# Patient Record
Sex: Female | Born: 1980 | Race: White | Hispanic: No | Marital: Married | Smoking: Never smoker
Health system: Southern US, Community
[De-identification: ages and names within clinical notes are randomized; demographics above are authoritative.]

## PROBLEM LIST (undated history)

## (undated) DIAGNOSIS — F419 Anxiety disorder, unspecified: Secondary | ICD-10-CM

## (undated) DIAGNOSIS — F32A Depression, unspecified: Secondary | ICD-10-CM

## (undated) DIAGNOSIS — Z8781 Personal history of (healed) traumatic fracture: Secondary | ICD-10-CM

## (undated) DIAGNOSIS — E0500 Thyrotoxicosis with diffuse goiter without thyrotoxic crisis or storm: Secondary | ICD-10-CM

## (undated) DIAGNOSIS — F909 Attention-deficit hyperactivity disorder, unspecified type: Secondary | ICD-10-CM

## (undated) HISTORY — PX: APPENDECTOMY: SHX54

---

## 2009-10-27 ENCOUNTER — Ambulatory Visit: Payer: Self-pay | Admitting: Internal Medicine

## 2010-02-16 ENCOUNTER — Ambulatory Visit: Payer: Self-pay | Admitting: Family Medicine

## 2011-01-25 ENCOUNTER — Ambulatory Visit: Payer: Self-pay | Admitting: Internal Medicine

## 2019-04-04 ENCOUNTER — Encounter (HOSPITAL_COMMUNITY): Payer: Self-pay | Admitting: Emergency Medicine

## 2019-04-04 ENCOUNTER — Other Ambulatory Visit: Payer: Self-pay

## 2019-04-04 ENCOUNTER — Emergency Department (HOSPITAL_COMMUNITY): Payer: BC Managed Care – PPO

## 2019-04-04 ENCOUNTER — Emergency Department (HOSPITAL_COMMUNITY)
Admission: EM | Admit: 2019-04-04 | Discharge: 2019-04-04 | Disposition: A | Discharge status: Home / Self Care | Payer: BC Managed Care – PPO | Attending: Emergency Medicine | Admitting: Emergency Medicine

## 2019-04-04 DIAGNOSIS — M79632 Pain in left forearm: Secondary | ICD-10-CM | POA: Diagnosis present

## 2019-04-04 DIAGNOSIS — M778 Other enthesopathies, not elsewhere classified: Secondary | ICD-10-CM

## 2019-04-04 DIAGNOSIS — M779 Enthesopathy, unspecified: Secondary | ICD-10-CM | POA: Insufficient documentation

## 2019-04-04 MED ORDER — KETOROLAC TROMETHAMINE 10 MG PO TABS
10.0000 mg | ORAL_TABLET | Freq: Once | ORAL | Status: AC
  Administered 2019-04-04: 23:00:00 10 mg via ORAL
  Filled 2019-04-04: qty 1

## 2019-04-04 MED ORDER — DICLOFENAC SODIUM 75 MG PO TBEC: 75.0000 mg | DELAYED_RELEASE_TABLET | Freq: Two times a day (BID) | ORAL | 0 refills | Status: DC

## 2019-04-04 MED ORDER — ACETAMINOPHEN 500 MG PO TABS
1000.0000 mg | ORAL_TABLET | Freq: Once | ORAL | Status: AC
  Administered 2019-04-04: 1000 mg via ORAL
  Filled 2019-04-04: qty 2

## 2019-04-04 MED ORDER — ONDANSETRON HCL 4 MG PO TABS
4.0000 mg | ORAL_TABLET | Freq: Once | ORAL | Status: AC
  Administered 2019-04-04: 23:00:00 4 mg via ORAL
  Filled 2019-04-04: qty 1

## 2019-04-04 MED ORDER — DEXAMETHASONE 4 MG PO TABS: 4.0000 mg | ORAL_TABLET | Freq: Two times a day (BID) | ORAL | 0 refills | Status: DC

## 2019-04-04 MED ORDER — PREDNISONE 20 MG PO TABS
40.0000 mg | ORAL_TABLET | Freq: Once | ORAL | Status: AC
  Administered 2019-04-04: 23:00:00 40 mg via ORAL
  Filled 2019-04-04: qty 2

## 2019-04-04 MED ORDER — HYDROCODONE-ACETAMINOPHEN 5-325 MG PO TABS: 1.0000 | ORAL_TABLET | ORAL | 0 refills | Status: DC | PRN

## 2019-04-04 NOTE — ED Provider Notes (Signed)
[facility] EMERGENCY DEPARTMENT Provider Note   CSN: [id number] Arrival date & time: 04/04/19  2132     History   Chief Complaint Chief Complaint  Patient presents with  . Arm Injury    HPI Alyssa Lutz is a 38 y.o. female.     Patient is a 38 year old female who presents to the emergency department with a complaint of right wrist and forearm area pain.  The patient states that around noon today she was reaching into her purse, she twisted her wrist forearm in an awkward position and she is been having pain has been increasing since that time.  The pain is in both the wrist and the forearm.  The patient states that she has a rare osteoporosis condition, and she has been told that she could have fractures very easily.  She is concerned because she is having increasing pain from noon up until the night.  She says that she has good function of the hand and wrist, but pain when she flexes her wrist or her fingers.  No injury to the elbow or shoulder.  No recent high fever, no chills, no operations or procedures, no falls or accidents reported.  The patient has not taken any medication for her discomfort up to this point.  The history is provided by the patient.    History reviewed. No pertinent past medical history.  There are no active problems to display for this patient.   History reviewed. No pertinent surgical history.   OB History   No obstetric history on file.      Home Medications    Prior to Admission medications   Not on File    Family History History reviewed. No pertinent family history.  Social History Social History   Tobacco Use  . Smoking status: Unknown If Ever Smoked  Substance Use Topics  . Alcohol use: Not on file  . Drug use: Not on file     Allergies   Patient has no allergy information on record.   Review of Systems Review of Systems  Constitutional: Negative for activity change and appetite change.  HENT: Negative for  congestion, ear discharge, ear pain, facial swelling, nosebleeds, rhinorrhea, sneezing and tinnitus.   Eyes: Negative for photophobia, pain and discharge.  Respiratory: Negative for cough, choking, shortness of breath and wheezing.   Cardiovascular: Negative for chest pain, palpitations and leg swelling.  Gastrointestinal: Negative for abdominal pain, blood in stool, constipation, diarrhea, nausea and vomiting.  Genitourinary: Negative for difficulty urinating, dysuria, flank pain, frequency and hematuria.  Musculoskeletal: Positive for arthralgias. Negative for back pain, gait problem, myalgias and neck pain.  Skin: Negative for color change, rash and wound.  Neurological: Negative for dizziness, seizures, syncope, facial asymmetry, speech difficulty, weakness and numbness.  Hematological: Negative for adenopathy. Does not bruise/bleed easily.  Psychiatric/Behavioral: Negative for agitation, confusion, hallucinations, self-injury and suicidal ideas. The patient is not nervous/anxious.      Physical Exam Updated Vital Signs BP (!) 137/93 (BP Location: Left Arm)   Pulse (!) 105   Temp 98.4 F (36.9 C) (Oral)   Resp 18   Ht 5\' 7"  (1.702 m)   Wt 79.4 kg   LMP 04/04/2019 (Approximate)   SpO2 100%   BMI 27.41 kg/m   Physical Exam Vitals signs and nursing note reviewed.  Constitutional:      Appearance: She is well-developed. She is not toxic-appearing.  HENT:     Head: Normocephalic.     Right Ear:  Tympanic membrane and external ear normal.     Left Ear: Tympanic membrane and external ear normal.  Eyes:     General: Lids are normal.     Pupils: Pupils are equal, round, and reactive to light.  Neck:     Musculoskeletal: Normal range of motion and neck supple.     Vascular: No carotid bruit.  Cardiovascular:     Rate and Rhythm: Normal rate and regular rhythm.     Pulses: Normal pulses.     Heart sounds: Normal heart sounds.  Pulmonary:     Effort: No respiratory distress.      Breath sounds: Normal breath sounds.  Abdominal:     General: Bowel sounds are normal.     Palpations: Abdomen is soft.     Tenderness: There is no abdominal tenderness. There is no guarding.  Musculoskeletal:     Right shoulder: Normal.     Right elbow: Normal.    Right wrist: She exhibits decreased range of motion and tenderness.     Right forearm: She exhibits tenderness. She exhibits no deformity.       Arms:  Lymphadenopathy:     Head:     Right side of head: No submandibular adenopathy.     Left side of head: No submandibular adenopathy.     Cervical: No cervical adenopathy.  Skin:    General: Skin is warm and dry.  Neurological:     Mental Status: She is alert and oriented to person, place, and time.     Cranial Nerves: No cranial nerve deficit.     Sensory: No sensory deficit.  Psychiatric:        Speech: Speech normal.      ED Treatments / Results  Labs (all labs ordered are listed, but only abnormal results are displayed) Labs Reviewed - No data to display  EKG None  Radiology No results found.  Procedures Procedures (including critical care time)  Medications Ordered in ED Medications - No data to display   Initial Impression / Assessment and Plan / ED Course  I have reviewed the triage vital signs and the nursing notes.  Pertinent labs & imaging results that were available during my care of the patient were reviewed by me and considered in my medical decision making (see chart for details).          Final Clinical Impressions(s) / ED Diagnoses MDM  Blood pressure is slightly elevated, pulse rate is elevated at 105, otherwise vital signs are within normal limits.  There are no neurovascular deficits appreciated.  The pain can be reproduced by flexion extension of the wrist,  flexion and extension of the fingers.  X-ray of the right wrist, as well as the right forearm are negative for fracture or dislocation.  I discussed these findings of  both the examination as well as the findings of the x-ray with the patient in terms of which he understands.  Patient is placed in a wrist forearm splint, she is given medication for pain and inflammation.  Of asked the patient to see her primary physician or orthopedics if not improving.  I discussed with the patient that occasionally a hairline fracture or chip type fracture can sometimes take several days before showing up on x-ray, and to return to the emergency department or see her primary physician if this problem is not improving.   Final diagnoses:  Forearm tendonitis    ED Discharge Orders  Ordered    diclofenac (VOLTAREN) 75 MG EC tablet  2 times daily     04/04/19 2309    dexamethasone (DECADRON) 4 MG tablet  2 times daily with meals     04/04/19 2309    HYDROcodone-acetaminophen (NORCO/VICODIN) 5-325 MG tablet  Every 4 hours PRN     04/04/19 2311           Ivery QualeBryant, Alin Chavira, PA-C 04/05/19 1605    Raeford RazorKohut, Stephen, MD 04/12/19 [phone]

## 2019-04-04 NOTE — ED Notes (Signed)
Pt ambulatory to waiting room. Pt verbalized understanding of discharge instructions.   

## 2019-04-04 NOTE — Discharge Instructions (Addendum)
Heating pad to the wrist/forearm area may be helpful. Use diclofenac and decadron 2 times daily with food. May use tylenol every 4 hours between diclofenac doses if needed for discomfort. May use norco for more severe pain. This medication may cause drowsiness. Please do not drink, drive, or participate in activity that requires concentration while taking this medication. Use the wrist splint for the next 5 days.

## 2019-04-04 NOTE — ED Notes (Signed)
Pt reports she has a rare osteoporosis and while reaching into her purse twisted her arm   Now has noted bruising and swelling to her R forearm and has pain  She has taken no OTC meds for her discomfort  Awaiting Eval

## 2019-04-04 NOTE — ED Triage Notes (Signed)
Patient states that she has osteoporosis and she was getting something out of her purse when she twisted her right forearm. Patient states this happened around lunch time and she is having severe pain at this time in right arm.

## 2019-04-05 MED FILL — Hydrocodone-Acetaminophen Tab 5-325 MG: ORAL | Qty: 6 | Status: AC

## 2019-07-12 ENCOUNTER — Ambulatory Visit: Payer: BC Managed Care – PPO | Admitting: Family Medicine

## 2019-09-13 ENCOUNTER — Ambulatory Visit: Payer: BC Managed Care – PPO | Admitting: Family Medicine

## 2020-03-24 IMAGING — DX RIGHT FOREARM - 2 VIEW
2 series · 2 of 2 positions shown · non-contrast
Comparison: None.

CLINICAL DATA: Twisting injury with bruising and pain

EXAM:
RIGHT FOREARM - 2 VIEW

[forearm ap]
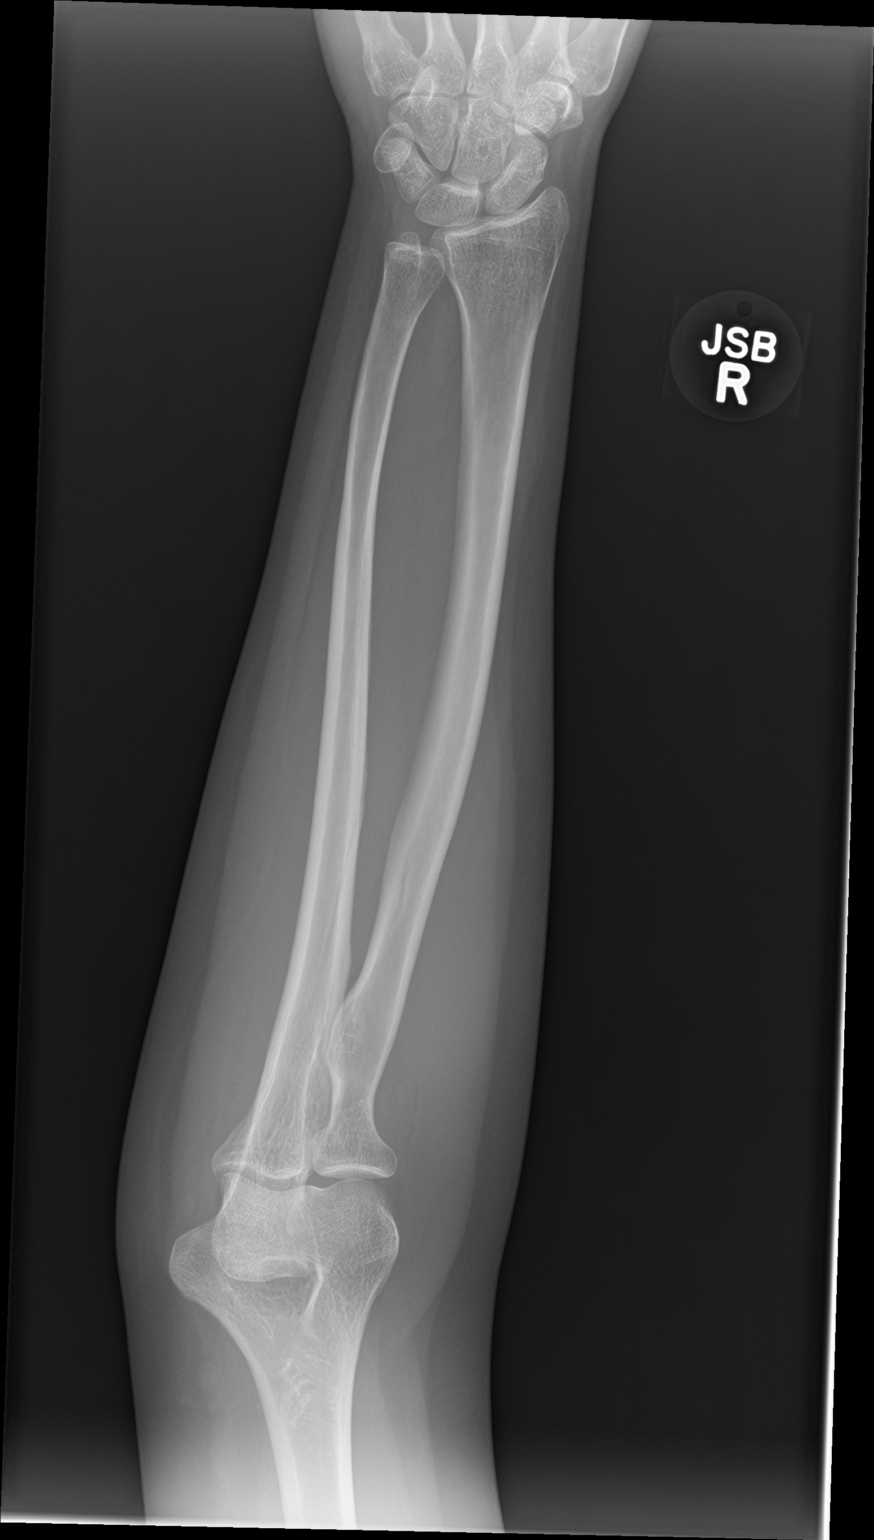

[forearm lat]
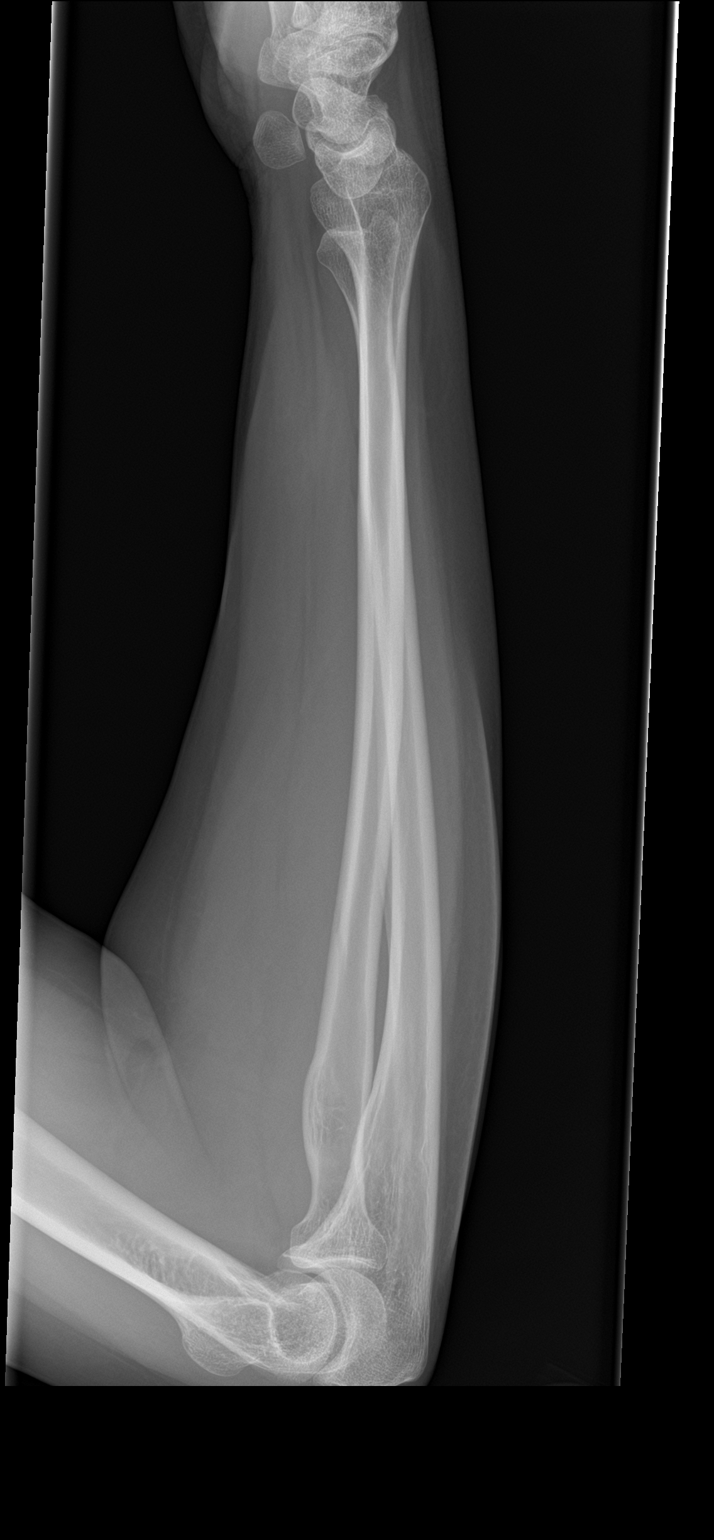

[2 of 2 positions shown; findings below may reference images not displayed]

FINDINGS: There is no evidence of fracture or other focal bone lesions. Soft
tissues are unremarkable.
IMPRESSION: Negative.

## 2021-10-14 ENCOUNTER — Other Ambulatory Visit: Payer: Self-pay

## 2021-10-14 ENCOUNTER — Encounter (HOSPITAL_COMMUNITY): Payer: Self-pay | Admitting: Emergency Medicine

## 2021-10-14 DIAGNOSIS — Z5321 Procedure and treatment not carried out due to patient leaving prior to being seen by health care provider: Secondary | ICD-10-CM | POA: Diagnosis not present

## 2021-10-14 DIAGNOSIS — Z30011 Encounter for initial prescription of contraceptive pills: Secondary | ICD-10-CM | POA: Insufficient documentation

## 2021-10-14 DIAGNOSIS — F17200 Nicotine dependence, unspecified, uncomplicated: Secondary | ICD-10-CM | POA: Insufficient documentation

## 2021-10-14 DIAGNOSIS — R2242 Localized swelling, mass and lump, left lower limb: Secondary | ICD-10-CM | POA: Diagnosis present

## 2021-10-14 NOTE — ED Triage Notes (Signed)
Pt c/o left lower leg swelling for a couple of hours. Pt states she smokes and takes birth control.

## 2021-10-15 ENCOUNTER — Emergency Department (HOSPITAL_COMMUNITY)
Admission: EM | Admit: 2021-10-15 | Discharge: 2021-10-15 | Disposition: A | Discharge status: Home / Self Care | Payer: BC Managed Care – PPO | Attending: Emergency Medicine | Admitting: Emergency Medicine

## 2021-10-15 HISTORY — DX: Thyrotoxicosis with diffuse goiter without thyrotoxic crisis or storm: E05.00

## 2022-10-04 ENCOUNTER — Ambulatory Visit
Admission: EM | Admit: 2022-10-04 | Discharge: 2022-10-04 | Disposition: A | Discharge status: Home / Self Care | Payer: BC Managed Care – PPO

## 2022-10-04 DIAGNOSIS — J069 Acute upper respiratory infection, unspecified: Secondary | ICD-10-CM | POA: Diagnosis not present

## 2022-10-04 HISTORY — DX: Depression, unspecified: F32.A

## 2022-10-04 HISTORY — DX: Anxiety disorder, unspecified: F41.9

## 2022-10-04 HISTORY — DX: Attention-deficit hyperactivity disorder, unspecified type: F90.9

## 2022-10-04 HISTORY — DX: Personal history of (healed) traumatic fracture: Z87.81

## 2022-10-04 MED ORDER — BENZONATATE 100 MG PO CAPS: 100.0000 mg | ORAL_CAPSULE | Freq: Three times a day (TID) | ORAL | 0 refills | Status: AC | PRN

## 2022-10-04 NOTE — Discharge Instructions (Signed)
You have a viral upper respiratory infection.  Symptoms should improve over the next week to 10 days.  If you develop chest pain or shortness of breath, go to the emergency room.  Please be aware that a cough may last for a few weeks after a viral infection.   Some things that can make you feel better are: - Increased rest - Increasing fluid with water/sugar free electrolytes - Acetaminophen and ibuprofen as needed for fever/pain - Salt water gargling, chloraseptic spray and throat lozenges for sore throat - OTC guaifenesin (Mucinex) 600 mg twice daily for congestion - Saline sinus flushes or a neti pot for congestion - Humidifying the air for blood nasal drainage -Tessalon Perles every 8 hours as needed for dry cough

## 2022-10-04 NOTE — ED Provider Notes (Signed)
[facility]    CSN: [id number] Arrival date & time: 10/04/22  0947      History   Chief Complaint Chief Complaint  Patient presents with   Cough    HPI Alyssa Lutz is a 42 y.o. female.   Patient presents today with upper respiratory symptoms for the past 5 days, acutely worsened this morning.  Reports last week, her son tested positive for the flu and reports the flu went through her home.  Reports now, she is having congested cough, pain in her chest when she coughs or takes a deep breath, chest tightness, chest and nasal congestion with blood in the nasal mucus, sore throat, swollen glands, sinus pressure and headache, bilateral ear pressure without drainage, decreased appetite, and fatigue.  She denies shortness of breath, runny nose, postnasal drainage, abdominal pain, nausea/vomiting, diarrhea, loss of taste or smell.  Reports she is also recently been around her parents to tested positive for COVID-19 2 weeks ago.  Reports over the weekend, her symptoms were improving, however they acutely worsened this morning.  Has been taking Mucinex twice daily for symptoms.  Patient denies history of chronic lung disease.  Does not currently smoke or vape.  Reports she quit smoking 6 years ago and smoked about 1 pack/day for 20 years.  Reports her husband currently vapes inside the home.    Past Medical History:  Diagnosis Date   ADHD    Anxiety    Depression    Graves disease    History of spinal fracture     There are no problems to display for this patient.   Past Surgical History:  Procedure Laterality Date   APPENDECTOMY     CESAREAN SECTION      OB History   No obstetric history on file.      Home Medications    Prior to Admission medications   Medication Sig Start Date End Date Taking? Authorizing Provider  amphetamine-dextroamphetamine (ADDERALL) 10 MG tablet Take 20 mg by mouth 2 (two) times daily. 05/05/22  Yes [provider]   benzonatate (TESSALON) 100 MG capsule Take 1 capsule (100 mg total) by mouth 3 (three) times daily as needed for cough. Do not take with alcohol or while driving or operating heavy machinery. May cause drowsiness. 10/04/22  Yes Eulogio Bear, NP  methimazole (TAPAZOLE) 5 MG tablet Take 1 tablet by mouth daily. 08/17/22  Yes [provider]  Vitamin D, Ergocalciferol, (DRISDOL) 1.25 MG (50000 UNIT) CAPS capsule Take by mouth. 06/17/22  Yes [provider]  Hulda Humphrey 0.4-35 MG-MCG tablet Take 1 tablet by mouth daily.    [provider]  buPROPion (WELLBUTRIN) 100 MG tablet Take 1 tablet by mouth daily.    [provider]  calcium citrate (CALCITRATE - DOSED IN MG ELEMENTAL CALCIUM) 950 (200 Ca) MG tablet Take by mouth.    [provider]  Cholecalciferol (D 1000) 25 MCG (1000 UT) capsule Take by mouth.    [provider]  diazepam (VALIUM) 2 MG tablet Take 2 mg by mouth daily as needed.    [provider]  Multiple Vitamin (MULTIVITAMIN) capsule multivitamin    [provider]    Family History History reviewed. No pertinent family history.  Social History Social History   Tobacco Use   Smoking status: Never   Smokeless tobacco: Never  Vaping Use   Vaping Use: Never used  Substance Use Topics   Alcohol use: Not Currently   Drug  use: Never     Allergies   Patient has no known allergies.   Review of Systems Review of Systems Per HPI  Physical Exam Triage Vital Signs ED Triage Vitals [10/04/22 1038]  Enc Vitals Group     BP 108/70     Pulse Rate 71     Resp 18     Temp 98.8 F (37.1 C)     Temp Source Oral     SpO2 98 %     Weight      Height      Head Circumference      Peak Flow      Pain Score      Pain Loc      Pain Edu?      Excl. in [place]?    No data found.  Updated Vital Signs BP 108/70 (BP Location: Right Arm)   Pulse 71   Temp 98.8 F (37.1 C) (Oral)   Resp 18   SpO2 98%    Visual Acuity Right Eye Distance:   Left Eye Distance:   Bilateral Distance:    Right Eye Near:   Left Eye Near:    Bilateral Near:     Physical Exam Vitals and nursing note reviewed.  Constitutional:      General: She is not in acute distress.    Appearance: Normal appearance. She is not ill-appearing or toxic-appearing.  HENT:     Head: Normocephalic and atraumatic.     Right Ear: Tympanic membrane, ear canal and external ear normal.     Left Ear: Tympanic membrane, ear canal and external ear normal.     Nose: Rhinorrhea present. No congestion.     Mouth/Throat:     Mouth: Mucous membranes are moist.     Pharynx: Oropharynx is clear. Posterior oropharyngeal erythema present. No oropharyngeal exudate.  Eyes:     General: No scleral icterus.    Extraocular Movements: Extraocular movements intact.  Cardiovascular:     Rate and Rhythm: Normal rate and regular rhythm.  Pulmonary:     Effort: Pulmonary effort is normal. No respiratory distress.     Breath sounds: Normal breath sounds. No wheezing, rhonchi or rales.  Abdominal:     General: Abdomen is flat. Bowel sounds are normal. There is no distension.     Palpations: Abdomen is soft.     Tenderness: There is no abdominal tenderness.  Musculoskeletal:     Cervical back: Normal range of motion and neck supple.  Lymphadenopathy:     Cervical: No cervical adenopathy.  Skin:    General: Skin is warm and dry.     Coloration: Skin is not jaundiced or pale.     Findings: No erythema or rash.  Neurological:     Mental Status: She is alert and oriented to person, place, and time.  Psychiatric:        Behavior: Behavior is cooperative.      UC Treatments / Results  Labs (all labs ordered are listed, but only abnormal results are displayed) Labs Reviewed - No data to display  EKG   Radiology No results found.  Procedures Procedures (including critical care time)  Medications Ordered in UC Medications - No data  to display  Initial Impression / Assessment and Plan / UC Course  I have reviewed the triage vital signs and the nursing notes.  Pertinent labs & imaging results that were available during my care of the patient were reviewed by me and considered in  my medical decision making (see chart for details).   Patient is well-appearing, normotensive, afebrile, not tachycardic, not tachypneic, oxygenating well on room air.    1. Viral URI with cough Suspect ongoing viral symptoms Examination and vital signs are reassuring today Supportive care discussed with patient including humidifier, Mucinex 600 mg twice daily, nasal saline rinses/steam showers Start cough suppressant if dry cough develops ER and return precautions discussed with patient Note given for work  The patient was given the opportunity to ask questions.  All questions answered to their satisfaction.  The patient is in agreement to this plan.    Final Clinical Impressions(s) / UC Diagnoses   Final diagnoses:  Viral URI with cough     Discharge Instructions      You have a viral upper respiratory infection.  Symptoms should improve over the next week to 10 days.  If you develop chest pain or shortness of breath, go to the emergency room.  Please be aware that a cough may last for a few weeks after a viral infection.   Some things that can make you feel better are: - Increased rest - Increasing fluid with water/sugar free electrolytes - Acetaminophen and ibuprofen as needed for fever/pain - Salt water gargling, chloraseptic spray and throat lozenges for sore throat - OTC guaifenesin (Mucinex) 600 mg twice daily for congestion - Saline sinus flushes or a neti pot for congestion - Humidifying the air for blood nasal drainage -Tessalon Perles every 8 hours as needed for dry cough     ED Prescriptions     Medication Sig Dispense Auth. Provider   benzonatate (TESSALON) 100 MG capsule Take 1 capsule (100 mg total) by mouth 3  (three) times daily as needed for cough. Do not take with alcohol or while driving or operating heavy machinery. May cause drowsiness. 21 capsule Eulogio Bear, NP      PDMP not reviewed this encounter.   Eulogio Bear, NP 10/04/22 [phone]

## 2022-10-04 NOTE — ED Triage Notes (Signed)
Pt reports cough and chest congestion started this morning. Reports son had Flu 1 week ago.

## 2022-10-11 ENCOUNTER — Ambulatory Visit
Admission: EM | Admit: 2022-10-11 | Discharge: 2022-10-11 | Disposition: A | Discharge status: Home / Self Care | Payer: BC Managed Care – PPO | Attending: Family Medicine | Admitting: Family Medicine

## 2022-10-11 DIAGNOSIS — H66011 Acute suppurative otitis media with spontaneous rupture of ear drum, right ear: Secondary | ICD-10-CM

## 2022-10-11 MED ORDER — AMOXICILLIN 875 MG PO TABS: 875.0000 mg | ORAL_TABLET | Freq: Two times a day (BID) | ORAL | 0 refills | Status: DC

## 2022-10-11 MED ORDER — CIPROFLOXACIN-DEXAMETHASONE 0.3-0.1 % OT SUSP: 4.0000 [drp] | Freq: Two times a day (BID) | OTIC | 0 refills | Status: DC

## 2022-10-11 MED ORDER — FLUTICASONE PROPIONATE 50 MCG/ACT NA SUSP: 1.0000 | Freq: Two times a day (BID) | NASAL | 2 refills | Status: AC

## 2022-10-11 NOTE — ED Triage Notes (Signed)
Right ear pain, pressure and drainage. That started yesterday. Took tylenol and ibuprofen last night.

## 2022-10-11 NOTE — Discharge Instructions (Signed)
In addition to over-the-counter pain relievers, may use Flonase twice daily and a decongestant such as Sudafed as needed to help with ear pressure, fluid buildup behind ears

## 2022-10-14 ENCOUNTER — Ambulatory Visit: Payer: Self-pay

## 2022-10-15 NOTE — ED Provider Notes (Signed)
[facility]    CSN: [id number] Arrival date & time: 10/11/22  0801      History   Chief Complaint Chief Complaint  Patient presents with   Otalgia    HPI Alyssa Lutz is a 42 y.o. female.   Patient presenting today with 1 day history of right ear pain, pressure, drainage.  States that she has had cold symptoms for about 2 weeks and progressively worsening pressure in the ear prior to yesterday.  Denies fever, chills, complete loss of hearing, nausea, vomiting.  Taking Tylenol and ibuprofen with minimal relief.    Past Medical History:  Diagnosis Date   ADHD    Anxiety    Depression    Graves disease    History of spinal fracture     There are no problems to display for this patient.   Past Surgical History:  Procedure Laterality Date   APPENDECTOMY     CESAREAN SECTION      OB History   No obstetric history on file.      Home Medications    Prior to Admission medications   Medication Sig Start Date End Date Taking? Authorizing Provider  amoxicillin (AMOXIL) 875 MG tablet Take 1 tablet (875 mg total) by mouth 2 (two) times daily. 10/11/22  Yes Volney American, PA-C  amphetamine-dextroamphetamine (ADDERALL) 10 MG tablet Take 20 mg by mouth 2 (two) times daily. 05/05/22  Yes [provider]  Hulda Humphrey 0.4-35 MG-MCG tablet Take 1 tablet by mouth daily.   Yes [provider]  benzonatate (TESSALON) 100 MG capsule Take 1 capsule (100 mg total) by mouth 3 (three) times daily as needed for cough. Do not take with alcohol or while driving or operating heavy machinery. May cause drowsiness. 10/04/22  Yes Eulogio Bear, NP  buPROPion (WELLBUTRIN) 100 MG tablet Take 1 tablet by mouth daily.   Yes [provider]  calcium citrate (CALCITRATE - DOSED IN MG ELEMENTAL CALCIUM) 950 (200 Ca) MG tablet Take by mouth.   Yes [provider]  Cholecalciferol (D 1000) 25 MCG (1000 UT) capsule Take by mouth.   Yes [provider]  ciprofloxacin-dexamethasone (CIPRODEX) OTIC suspension Place 4 drops into the right ear 2 (two) times daily. 10/11/22  Yes Volney American, PA-C  diazepam (VALIUM) 2 MG tablet Take 2 mg by mouth daily as needed.   Yes [provider]  fluticasone (FLONASE) 50 MCG/ACT nasal spray Place 1 spray into both nostrils 2 (two) times daily. 10/11/22  Yes Volney American, PA-C  methimazole (TAPAZOLE) 5 MG tablet Take 1 tablet by mouth daily. 08/17/22  Yes [provider]  Multiple Vitamin (MULTIVITAMIN) capsule multivitamin   Yes [provider]  Vitamin D, Ergocalciferol, (DRISDOL) 1.25 MG (50000 UNIT) CAPS capsule Take by mouth. 06/17/22   [provider]    Family History History reviewed. No pertinent family history.  Social History Social History   Tobacco Use   Smoking status: Never   Smokeless tobacco: Never  Vaping Use   Vaping Use: Never used  Substance Use Topics   Alcohol use: Not Currently   Drug use: Never     Allergies   Patient has no known allergies.   Review of Systems Review of Systems Per HPI  Physical Exam Triage Vital Signs ED Triage Vitals [10/11/22 0822]  Enc Vitals Group     BP 105/71     Pulse Rate 89     Resp 16  Temp 98.4 F (36.9 C)     Temp Source Oral     SpO2 98 %     Weight      Height      Head Circumference      Peak Flow      Pain Score 5     Pain Loc      Pain Edu?      Excl. in [place]?    No data found.  Updated Vital Signs BP 105/71 (BP Location: Right Arm)   Pulse 89   Temp 98.4 F (36.9 C) (Oral)   Resp 16   LMP 09/10/2022 (Approximate)   SpO2 98%   Visual Acuity Right Eye Distance:   Left Eye Distance:   Bilateral Distance:    Right Eye Near:   Left Eye Near:    Bilateral Near:     Physical Exam Vitals and nursing note reviewed.  Constitutional:      Appearance: Normal appearance.  HENT:     Head: Atraumatic.     Right Ear: External ear normal.      Left Ear: Tympanic membrane and external ear normal.     Ears:     Comments: Right TM edematous with tiny rupture, slight drainage present in EAC    Nose: Nose normal.     Mouth/Throat:     Mouth: Mucous membranes are moist.     Pharynx: No posterior oropharyngeal erythema.  Eyes:     Extraocular Movements: Extraocular movements intact.     Conjunctiva/sclera: Conjunctivae normal.  Cardiovascular:     Rate and Rhythm: Normal rate and regular rhythm.     Heart sounds: Normal heart sounds.  Pulmonary:     Effort: Pulmonary effort is normal.     Breath sounds: Normal breath sounds. No wheezing or rales.  Musculoskeletal:        General: Normal range of motion.     Cervical back: Normal range of motion and neck supple.  Skin:    General: Skin is warm and dry.  Neurological:     Mental Status: She is alert and oriented to person, place, and time.  Psychiatric:        Mood and Affect: Mood normal.        Thought Content: Thought content normal.      UC Treatments / Results  Labs (all labs ordered are listed, but only abnormal results are displayed) Labs Reviewed - No data to display  EKG   Radiology No results found.  Procedures Procedures (including critical care time)  Medications Ordered in UC Medications - No data to display  Initial Impression / Assessment and Plan / UC Course  I have reviewed the triage vital signs and the nursing notes.  Pertinent labs & imaging results that were available during my care of the patient were reviewed by me and considered in my medical decision making (see chart for details).     Will treat with amoxicillin for ear infection as well as Ciprodex drops due to slight TM rupture, Flonase and Sudafed recommended for eustachian tube dysfunction. Over-the-counter pain relievers as needed.  Final Clinical Impressions(s) / UC Diagnoses   Final diagnoses:  Non-recurrent acute suppurative otitis media of right ear with spontaneous  rupture of tympanic membrane     Discharge Instructions      In addition to over-the-counter pain relievers, may use Flonase twice daily and a decongestant such as Sudafed as needed to help with ear pressure, fluid buildup behind  ears    ED Prescriptions     Medication Sig Dispense Auth. Provider   amoxicillin (AMOXIL) 875 MG tablet Take 1 tablet (875 mg total) by mouth 2 (two) times daily. 20 tablet Volney American, PA-C   ciprofloxacin-dexamethasone [facility]) OTIC suspension Place 4 drops into the right ear 2 (two) times daily. 7.5 mL Volney American, PA-C   fluticasone [facility]) 50 MCG/ACT nasal spray Place 1 spray into both nostrils 2 (two) times daily. 16 g Volney American, [place]      PDMP not reviewed this encounter.   Volney American, [place] 10/15/22 [phone]

## 2023-03-29 ENCOUNTER — Telehealth (HOSPITAL_COMMUNITY): Payer: Self-pay | Admitting: Licensed Clinical Social Worker

## 2023-03-29 ENCOUNTER — Ambulatory Visit (HOSPITAL_COMMUNITY)
Admission: EM | Admit: 2023-03-29 | Discharge: 2023-03-29 | Disposition: A | Discharge status: Home / Self Care | Payer: PRIVATE HEALTH INSURANCE | Attending: Psychiatry | Admitting: Psychiatry

## 2023-03-29 DIAGNOSIS — F322 Major depressive disorder, single episode, severe without psychotic features: Secondary | ICD-10-CM

## 2023-03-29 MED ORDER — QUETIAPINE FUMARATE 25 MG PO TABS: 25.0000 mg | ORAL_TABLET | Freq: Every day | ORAL | 0 refills | Status: DC

## 2023-03-29 MED ORDER — DIAZEPAM 2 MG PO TABS: 2.0000 mg | ORAL_TABLET | Freq: Every day | ORAL | Status: AC | PRN

## 2023-03-29 MED ORDER — METHIMAZOLE 5 MG PO TABS: 5.0000 mg | ORAL_TABLET | Freq: Every day | ORAL | Status: AC

## 2023-03-29 NOTE — Discharge Instructions (Addendum)
It is imperative that you follow through with treatment recommendations within 5-7 days from the day of discharge to mitigate further risk to your safety and overall mental well-being.  A list of outpatient therapy and psychiatric providers for medication management has been provided below to get you started in finding the right provider for you.            [place]  [facility] [address] [place], [place], [address] [phone] phone (Medicare, Private insurance except Tricare, Blue Local, and Blue Home)  [facility] [address] [place], [place], [address] [phone] phone (Aetna, AmeriHealth [place], BCBS, Cigna, Evernorth, Friday Health Plans, Gateway Health, BCBS Healthy Blue, Humana, Magellan Health, Medcost, Medicare, Medicaid, Optum, Tricare, UHC, UHC Community Plan, Wellcare)  [facility] [address] [place], [place], [address] [phone] phone [phone] phone [phone] fax  [facility] [address] [place], [place], [address] [phone] phone (Call to confirm insurance coverage) Consultation & Support Services     o Drop-In Hours: 1:00 PM to 5:00 PM     o Days: Monday - Thursday  [facility] (24/7)   [facility] [address] [place], [place], [address] [phone] phone (Healthy [place], UHC, [place] Medicaid, Vaya and Partners, Humana)      [facility] [address] [place], [place], [address] [phone] phone [email or web address]  (to complete the intake form and upload ID and insurance cards)  [facility] [address] [place], [place], [address] [phone] phone (Aetna, Alliance, BCBS, [place] Complete Health, Cigna, Medicare, Optum, UHC, Wellcare, and certain Medicaid plans)  [facility] [address] [place], [place], [address] [phone] phone [phone] fax (Medicaid, Medicare, Self-pay, call about other insurance coverage)  [facility] (age 13+) [address] [place], [place], [address] [phone] hone [phone] fax (Cigna, MedCost, BCBS, Aetna, Magellan, First Health, Healthgram, Humana, Tricare, Multiplan, certain Medicare providers, UHC, UMR)  [facility] [address] [place], [place], [address] [phone] phone (Medicare, Medicaid, Aetna, Cigna, call about other insurance coverage)  [facility] [address] [place], [place], [address] [phone] phone [phone] fax (Call [phone] to see what insurance is accepted) (Gerald Plovsky, MD specializes in geropsych)  [facility]  (medication management only) [address] [place], [place], [address] [phone] phone [phone] fax (Medicare, Medicaid, BCBS, Tricare, Humana, MedCost, Cigna, UHC, UMR)  [facility] (medication management only) [address] [place], [place], [address] [phone] phone [phone] fax (Cigna, Medicare, BCBS, Aetna, Tricare East)  [facility] [address] [place], [place], [address] [phone] phone [phone] fax (Aetna, BCBS, Cigna, Magellan, MedCost, UHC, [place] Medicaid/[place] Health Choice)  [facility] [address] [place], [place], [address] [phone] phone [phone] fax (Medicare, Medicaid, BCBS)  [facility] [address] [place], [place] [address] [phone] phone (Aetna, BCBS, Cigna, CBHA, MedCost, Medicare, Magellan, UHC) Does genetic testing for medications; does transcranial magnetic stimulation along with basic services)  [facility] [address] [place], [place],  [address] [phone] phone (Call about insurance coverage)  [facility] [address] [place], [place], [address] [phone] phone [phone] fax (Call about insurance coverage)  [facility] [address] , , [address] [phone] phone [phone]   fax (Call about insurance coverage)  [facility] [address] [place], [place], [address] [phone] phone (Medicaid, Tricare, BCBS, Aetna, Humana)  [facility] [address] [place], [place], [address] [phone] phone (Medicaid, Medicare, call about other insurance coverage)  [facility] [address] [place], [place], [address] [phone] phone [phone] fax (Medicaid, Medicare, Tricare, call about other insurance coverage)  [facility] [address] [place], [place], [address] [phone] phone (Aetna, BCBS, Cigna, MedCost, Tricare, Medicaid types - Alliance, AmeriHealth, Partners, Vaya, [place] Health Choice, Healthy Blue, [place], Wellcare, and Complete)  [facility] [address] [place], [place], [address] [phone] phone Completely online treatment platform Contact: Jonay Argier - Behavioral Health Outreach Specialist [phone] phone [phone] fax (Aetna, BCBS, Cigna, Friday Health Plan, Humana, Magellan, MedCost, Medicaid, Medicare, UBH Optum)                                               

## 2023-03-29 NOTE — Progress Notes (Addendum)
   03/29/23 0758  [facility] Triage Screening (Walk-ins at [facility] only)  How Did You Hear About [place]? Family/Friend  What Is the Reason for Your Visit/Call Today? Pt presents to [facility] voluntarily accompanied by her friend seeking medication managment. Pt states she was taking wellbutrin , adderall and valium, she reports she stopped taking those medications about 1 month ago. Pt reports being diagnosed with depression, anxiety and ADHD. She also reports a family hx of Bipolar disorder and believes she may also suffer from Bipolar as well. 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 She reports a rapid cycling of moods, anger outbursts, crying spells, passive SI. She reports an "out of control" anger outburst where she scared her son over the weekend and would like to possibly get started on  a mood stabilizer.  She reports she was using marijuana, delta 8 and 9 to self-medicate, but stopped using about 1 week ago. Pt reports having passive SI a few days ago but denies currently. Pt denies any plans or intent to harm herself. She reports 1 past suicide attempt 20 years ago where she attempted to overdose on clonopine and she was hospitalized.She reports she lives with her husband and 7 year old son. She reports stressors from chronic pain, difficulty in her marriage and stress from her job. Pt denies SI/HI and AVH currently.  How Long Has This Been Causing You Problems? <Week  Have You Recently Had Any Thoughts About Hurting Yourself? Yes  How long ago did you have thoughts about hurting yourself? a few days ago but denies currently  Are You Planning to Commit Suicide/Harm Yourself At This time? No  Have you Recently Had Thoughts About Hurting Someone Karolee Ohs? No  Are You Planning To Harm Someone At This Time? No  Are you currently experiencing any auditory, visual or other hallucinations? No  Have You Used Any Alcohol or Drugs in the Past 24 Hours? No  Do you have any current medical co-morbidities that  require immediate attention? No  Clinician description of patient physical appearance/behavior: nervous, cooperative, well groomed  What Do You Feel Would Help You the Most Today? Medication(s);Treatment for Depression or other mood problem  If access to [facility] was not available, would you have sought care in the Emergency Department? No  Determination of Need Routine (7 days)  Options For Referral Outpatient Therapy;Medication Management

## 2023-03-29 NOTE — ED Notes (Signed)
Patient was discharged by the provider.

## 2023-03-29 NOTE — ED Provider Notes (Signed)
Behavioral Health Urgent Care Medical Screening Exam  Patient Name: Alyssa Lutz MRN: [id number] Date of Evaluation: 03/29/23 Chief Complaint:  Depression, and unstable mood Diagnosis:  Final diagnoses:  MDD (major depressive disorder), severe (HCC)    History of Present illness: Alyssa Lutz is a 42 y.o. female patient presented to [facility] as a walk in  accompanied by her spouse  with complaints of depression, and unstable mood.  Alyssa Lutz, 42 y.o., female patient seen face to face by this provider and chart reviewed on 03/29/23.  Chart review is limited.  Per patient she has been diagnosed with bipolar 2 disorder, depression, and anxiety.  Reports she has tried Lamictal, trazodone, Valium, and Seroquel in the past.  Reports Seroquel was helpful.  She did have services in place with [facility] with Starling Manns, PA.  However she stopped seeing provider roughly 2-3 months ago.   On evaluation Alyssa Lutz reports she has dealt with depression anxiety for many years.  She has tried different medications but has never stayed consistent.  She reports being diagnosed with bipolar 2.  Reports the only medication that she has taken over the past couple months is Valium and reports it is ineffective with helping with her anxiety or depression.  She has no current psychiatric services in place.  Reports over the past few months she has noticed an increase in her depression, anxiety, and mood instability.  She is having difficulty performing her job duties.  She is endorsing depression with feelings of decreased motivation, decreased focus, irritability, tearfulness, and decreased sleep.  She has a depressed affect.  This past weekend she had a difficult time due to her mood shifting from high to low throughout the whole weekend.  She was unable to sleep due to racing thoughts.  She presents today requesting resources for outpatient medication management and therapy.  She is also requesting to be  started on a mood stabilizer temporarily until she can have an appointment with a provider.  Of note patient is well-educated on psychiatric services as she is a LCSW.  During evaluation Alyssa Lutz is observed sitting in the assessment room in no acute distress.  She is alert/oriented x 4, cooperative, attentive, and anxious.  She has normal speech and behavior.  She adamantly denies suicidal ideations.  She endorses one previous suicide attempt when she was younger with an inpatient psychiatric admission at that time.  She is able to verbally contract for safety.  She identifies her 47-year-old child and spouse as her motivating factors.  Reports her family is supportive.  She denies homicidal ideations.  She denies auditory/visual hallucinations. Objectively there is no evidence of psychosis/mania or delusional thinking.  Patient is able to converse coherently, goal directed thoughts, no distractibility, or pre-occupation.  Patient answered question appropriately.    Patient had services in place with [facility] but ended those services roughly 2-3 months ago.  She still has her prescription for Valium 2 mg twice daily if needed for anxiety.  Patient is requesting to start mood stabilizer.  She has taken Seroquel in the past and tolerated it without any adverse reactions.  She is willing to restart medication to help with her mood, depression, and sleep.  Explained the patient she cannot take Seroquel and Valium at the same time.  Discussed patient to talk to her PCP to see if there is any concerns with her taking antipsychotic.  She is in agreement  [facility] ED  from 03/29/2023 in [facility] ED from 10/11/2022 in [facility] [place] [facility] from [facility]/15/2021 in [facility] Emergency Department at [facility]  C-SSRS RISK CATEGORY No Risk No Risk No Risk       Psychiatric Specialty Exam  Presentation  General Appearance:Appropriate for  Environment; Casual  Eye Contact:Good  Speech:Clear and Coherent; Normal Rate  Speech Volume:Normal  Handedness:Right   Mood and Affect  Mood:Anxious; Depressed  Affect:Congruent   Thought Process  Thought Processes:Coherent  Descriptions of Associations:Intact  Orientation:Full (Time, Place and Person)  Thought Content:Logical    Hallucinations:None  Ideas of Reference:None  Suicidal Thoughts:No  Homicidal Thoughts:No   Sensorium  Memory:Immediate Good; Recent Good; Remote Good  Judgment:Good  sight:Good   Executive Functions  Concentration:Good  Attention Span:Good  Recall:Good  Fund of Knowledge:Good  Language:Good   Psychomotor Activity  Psychomotor Activity:Normal   Assets  Assets:Communication Skills; Physical Health; Resilience; Social Support; Desire for Improvement; Financial Resources/Insurance; Housing; Intimacy   Sleep  Sleep:Poor  Number of hours: 3   Physical Exam: Physical Exam Vitals and nursing note reviewed.  Constitutional:      General: She is not in acute distress.    Appearance: Normal appearance. She is not ill-appearing.  HENT:     Head: Normocephalic.  Eyes:     General:        Right eye: No discharge.        Left eye: No discharge.  Cardiovascular:     Rate and Rhythm: Normal rate.  Pulmonary:     Effort: Pulmonary effort is normal.  Musculoskeletal:        General: Normal range of motion.     Cervical back: Normal range of motion.  Skin:    General: Skin is warm and dry.  Neurological:     Mental Status: She is alert and oriented to person, place, and time.  Psychiatric:        Attention and Perception: Attention and perception normal.        Mood and Affect: Affect normal. Mood is anxious and depressed.        Speech: Speech normal.        Behavior: Behavior is cooperative.        Thought Content: Thought content normal.        Cognition and Memory: Cognition normal.        Judgment: Judgment  normal.    Review of Systems  Constitutional: Negative.   HENT: Negative.    Eyes: Negative.   Respiratory: Negative.    Cardiovascular: Negative.   Musculoskeletal: Negative.   Skin: Negative.   Neurological: Negative.   Psychiatric/Behavioral:  Positive for depression. The patient is nervous/anxious.    Blood pressure (!) 133/92, pulse 96, temperature 97.9 F (36.6 C), temperature source Oral, resp. rate 18, SpO2 100%. There is no height or weight on file to calculate BMI.  Musculoskeletal: Strength & Muscle Tone: within normal limits Gait & Station: normal Patient leans: N/A   [facility] MSE Discharge Disposition for Follow up and Recommendations: Based on my evaluation the patient does not appear to have an emergency medical condition and can be discharged with resources and follow up care in outpatient services for Medication Management, Partial Hospitalization Program, and Individual Therapy  Discharge patient  Referral made to [facility]  Provided outpatient psychiatric resources for medication management and therapy  Provided prescription for Seroquel 25 mg nightly for 2 weeks.  Ardis Hughs, NP  03/29/2023, 9:55 AM

## 2023-03-30 ENCOUNTER — Encounter (HOSPITAL_COMMUNITY): Payer: Self-pay

## 2023-03-30 ENCOUNTER — Other Ambulatory Visit (HOSPITAL_COMMUNITY): Payer: PRIVATE HEALTH INSURANCE | Attending: Psychiatry | Admitting: Licensed Clinical Social Worker

## 2023-03-30 DIAGNOSIS — F419 Anxiety disorder, unspecified: Secondary | ICD-10-CM | POA: Diagnosis present

## 2023-03-30 DIAGNOSIS — F411 Generalized anxiety disorder: Secondary | ICD-10-CM | POA: Insufficient documentation

## 2023-03-30 DIAGNOSIS — F332 Major depressive disorder, recurrent severe without psychotic features: Secondary | ICD-10-CM | POA: Insufficient documentation

## 2023-03-30 DIAGNOSIS — R4589 Other symptoms and signs involving emotional state: Secondary | ICD-10-CM | POA: Insufficient documentation

## 2023-03-30 NOTE — Psych (Signed)
Virtual Visit via Video Note  I connected with Alyssa Lutz on 03/30/23 at 10:00 AM EDT by a video enabled telemedicine application and verified that I am speaking with the correct person using two identifiers.  Location: Patient: pt's home in [place], [place] Provider: clinical office in [place], [place]   I discussed the limitations of evaluation and management by telemedicine and the availability of in person appointments. The patient expressed understanding and agreed to proceed.   I discussed the assessment and treatment plan with the patient. The patient was provided an opportunity to ask questions and all were answered. The patient agreed with the plan and demonstrated an understanding of the instructions.   The patient was advised to call back or seek an in-person evaluation if the symptoms worsen or if the condition fails to improve as anticipated.  I provided 70 minutes of non-face-to-face time during this encounter.   Wyvonnia Lora, LCSW   Comprehensive Clinical Assessment (CCA) Note  03/30/2023 Alyssa Lutz [id number]  Chief Complaint:  Chief Complaint  Patient presents with   Anxiety   Depression   Visit Diagnosis: MDD, GAD    CCA Screening, Triage and Referral (STR)  Patient Reported Information How did you hear about [place]? Other (Comment)  Referral name: [facility]  Referral phone number: No data recorded  Whom do you see for routine medical problems? Other (Comment)  Practice/Facility Name: No data recorded Practice/Facility Phone Number: No data recorded Name of Contact: No data recorded Contact Number: No data recorded Contact Fax Number: No data recorded Prescriber Name: No data recorded Prescriber Address (if known): No data recorded  What Is the Reason for Your Visit/Call Today? depression, anxiety, possible bipolar  How Long Has This Been Causing You Problems? 1-6 months  What Do You Feel Would Help You the Most Today? Treatment for Depression  or other mood problem   Have You Recently Been in Any Inpatient Treatment (Hospital/Detox/Crisis Center/28-Day Program)? No  Name/Location of Program/Hospital:No data recorded How Long Were You There? No data recorded When Were You Discharged? No data recorded  Have You Ever Received Services From [facility] Before? No  Who Do You See at [facility]? No data recorded  Have You Recently Had Any Thoughts About Hurting Yourself? Yes  Are You Planning to Commit Suicide/Harm Yourself At This time? No   Have you Recently Had Thoughts About Hurting Someone Alyssa Lutz? No  Explanation: No data recorded  Have You Used Any Alcohol or Drugs in the Past 24 Hours? No  How Long Ago Did You Use Drugs or Alcohol? No data recorded What Did You Use and How Much? No data recorded  Do You Currently Have a Therapist/Psychiatrist? No  Name of Therapist/Psychiatrist: No data recorded  Have You Been Recently Discharged From Any Office Practice or Programs? No  Explanation of Discharge From Practice/Program: No data recorded    CCA Screening Triage Referral Assessment Type of Contact: No data recorded Is this Initial or Reassessment? No data recorded Date Telepsych consult ordered in [facility]:  No data recorded Time Telepsych consult ordered in [facility]:  No data recorded  Patient Reported Information Reviewed? No data recorded Patient Left Without Being Seen? No data recorded Reason for Not Completing Assessment: No data recorded  Collateral Involvement: chart review   Does Patient Have a Court Appointed Legal Guardian? No data recorded Name and Contact of Legal Guardian: No data recorded If Minor and Not Living with Parent(s), Who has Custody? No data recorded  Is CPS involved or ever been involved? Never  Is APS involved or ever been involved? Never   Patient Determined To Be At Risk for Harm To Self or Others Based on Review of Patient Reported Information or Presenting Complaint? No  Method:  No data recorded Availability of Means: No data recorded Intent: No data recorded Notification Required: No data recorded Additional Information for Danger to Others Potential: No data recorded Additional Comments for Danger to Others Potential: No data recorded Are There Guns or Other Weapons in Your Home? No  Types of Guns/Weapons: No data recorded Are These Weapons Safely Secured?                            No data recorded Who Could Verify You Are Able To Have These Secured: No data recorded Do You Have any Outstanding Charges, Pending Court Dates, Parole/Probation? No data recorded Contacted To Inform of Risk of Harm To Self or Others: No data recorded  Location of Assessment: Other (comment)   Does Patient Present under Involuntary Commitment? No  IVC Papers Initial File Date: No data recorded  [place] of Residence: [place]   Patient Currently Receiving the Following Services: Not Receiving Services   Determination of Need: Routine (7 days)   Options For Referral: Partial Hospitalization     CCA Biopsychosocial Intake/Chief Complaint:  Alyssa Lutz is a 42yo female referred to [facility] by [facility]. She cites her stressors as her current job and ongoing marital issues, as well as concern for bipolar 2. She reports she stopped taking psych meds Wellbutrin and Adderall to assess what she would be like at baseline. She endorses decreased ADLs, stating she will often go 3-4 days without showering. She states her only psych med at this time is Seroquel 25 mg, which was prescribed at [facility] on 8/6. She endorses outpatient medication management on and off and previous therapy. She endorses one previous hospitalization following a suicide attempt in her early twenties, denies other attempts and hospitalizations. She denies SI at this time, reports recent passive SI. She denies NSSI, current substance use, HI, AVH, and states there are no weapons in her home. She reports she has been diagnosed  with ADHD, depression, seasonal depression, and anxiety. She reports she was diagnosed with bipolar 2 while in college and suspects she has bipolar 2. She reports she has downplayed symptoms to mental health professionals due being in denial about having bipolar disorder. She reports her brother has bipolar 1, her sister has bipolar 2, her mother is suspected to have BPD, and her niece has schizoaffective disorder. She cites her husband, friends, and family as her supports, although she states she is not honest with the people in her life about her symptoms. She currently lives with her husband and 6yo son. She reports she previously used marijuana and delta 8 and 9 daily, but stopped a week ago. She reports chronic back pain caused by multiple compression fractures when her son was an infant, which resulted from pregnancy induced osteoporosis.  Current Symptoms/Problems: recently had SI with a plan the weekend before last after anger outburst over the weekend wherein she felt out of control and screamed at her son, "highs are too high, lows are too low," recent passive SI over the past few days but denies current, recent hopelessness, feeling overwhelmed, difficulty controlling anger 3-4 times per day particularly over the weekend, erratic sleep, tearfulness, isolating, depersonalization. When asked about mania/hypomania: "I feel  like I go through days where I have good days where I have energy. I'll over-shop, I'll over-clean. I'll laugh, I'm joking, everything's funny, I'm being almost inappropriate with my jokes and I'm sharing too much with my coworkers, I feel like I'm rapidly speaking, I feel internally pent up, it almost feels like I'm high. I'll have flight of ideas. I'll have this thought like 'what's wrong with you? You're not happy. You're a piece of crap.'" Then describes depressive episodes with passive SI, anger, tearfulness. She states sometimes the depression lasts longer or can last a couple  days, and can rapidly turn to anger. Last experienced potential hypomanic episode over the weekend during her son's birthday.   Patient Reported Schizophrenia/Schizoaffective Diagnosis in Past: No   Strengths: motivation for tx  Preferences: none stated  Abilities: able to engage in tx   Type of Services Patient Feels are Needed: improvement in functioning and reduction in symptoms   Initial Clinical Notes/Concerns: concern for bipolar 2   Mental Health Symptoms Depression:   Hopelessness; Change in energy/activity; Fatigue; Irritability; Sleep (too much or little); Tearfulness; Difficulty Concentrating; Worthlessness   Duration of Depressive symptoms:  Greater than two weeks   Mania:   Increased Energy; Irritability   Anxiety:    Difficulty concentrating; Fatigue; Irritability; Restlessness; Worrying; Tension   Psychosis:   None   Duration of Psychotic symptoms: No data recorded  Trauma:   None   Obsessions:   None   Compulsions:   None   Inattention:   Avoids/dislikes activities that require focus; Disorganized; Symptoms present in 2 or more settings   Hyperactivity/Impulsivity:   Feeling of restlessness   Oppositional/Defiant Behaviors:   None   Emotional Irregularity:   Intense/inappropriate anger; Mood lability; Recurrent suicidal behaviors/gestures/threats   Other Mood/Personality Symptoms:  No data recorded   Mental Status Exam Appearance and self-care  Stature:   Average   Weight:   Average weight   Clothing:   Casual   Grooming:   Normal   Cosmetic use:   None   Posture/gait:   Tense   Motor activity:   Restless   Sensorium  Attention:   Normal   Concentration:   Normal   Orientation:   X5   Recall/memory:   Normal   Affect and Mood  Affect:   Anxious   Mood:   Anxious; Depressed   Relating  Eye contact:   Normal   Facial expression:   Responsive   Attitude toward examiner:   Cooperative   Thought  and Language  Speech flow:  Clear and Coherent   Thought content:   Appropriate to Mood and Circumstances   Preoccupation:   None   Hallucinations:   None   Organization:  goal-directed  [employer] of Knowledge:   Average   Intelligence:   Average   Abstraction:   Normal   Judgement:   Good   Reality Testing:   Adequate   Insight:   Good   Decision Making:   Normal   Social Functioning  Social Maturity:   Isolates; Responsible   Social Judgement:   Normal   Stress  Stressors:   Illness; Work; Relationship   Coping Ability:   Overwhelmed; Exhausted   Skill Deficits:   Activities of daily living   Supports:   Family; Friends/Service system     Religion: Religion/Spirituality Are You A Religious Person?: No  Leisure/Recreation: Leisure / Recreation Do You Have Hobbies?: Yes Leisure and Hobbies: used  to do photography, legos, hiking  Exercise/Diet: Exercise/Diet Have You Gained or Lost A Significant Amount of Weight in the Past Six Months?: No Do You Follow a Special Diet?: No Do You Have Any Trouble Sleeping?: Yes Explanation of Sleeping Difficulties: erratic sleep   CCA Employment/Education Employment/Work Situation: Employment / Work Situation Employment Situation: Employed 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, music, 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 [employer] Long has Patient Been Employed?: 2 years Are You Satisfied With Your Job?: No Do You Work More Than One Job?: No Work Stressors: pt forced into a different role What is the Longest Time Patient has Held a Job?: 15 years Where was the Patient Employed at that Time?: [employer]  Education: Education Is Patient Currently Attending School?: No Did Garment/textile technologist From [employer]?: Yes Did Theme park manager?: Yes Did Designer, television/film set?: Yes What is Your Geophysicist/field seismologist Degree?: MSW   CCA Family/Childhood History Family and Relationship  History: Family history Marital status: Married Number of Years Married: 5 What types of issues is patient dealing with in the relationship?: Pt had an affair and husband found out. Also tension resulting from pt not being able to lift anything due to injury when their son was an infant. Additional relationship information: "My husband and I wouldn't be together if it wasn't for Madaline Guthrie" (pt's son). States they haven't slept in the same bed for the past five years. Not sexually active with husband. Are you sexually active?: Yes Does patient have children?: Yes How many children?: 1 How is patient's relationship with their children?: close  Childhood History:  Childhood History By whom was/is the patient raised?: Both parents Description of patient's relationship with caregiver when they were a child: "It was good. I always felt loved and supported. I always had my needs met." Patient's description of current relationship with people who raised him/her: Distant. "I just want to protect them from stress." How were you disciplined when you got in trouble as a child/adolescent?: normal Does patient have siblings?: Yes Number of Siblings: 3 Description of patient's current relationship with siblings: "distant" Did patient suffer any verbal/emotional/physical/sexual abuse as a child?: No Did patient suffer from severe childhood neglect?: No Has patient ever been sexually abused/assaulted/raped as an adolescent or adult?: No Was the patient ever a victim of a crime or a disaster?: No Witnessed domestic violence?: No Has patient been affected by domestic violence as an adult?: No  Child/Adolescent Assessment:     CCA Substance Use Alcohol/Drug Use: Alcohol / Drug Use History of alcohol / drug use?: No history of alcohol / drug abuse                         ASAM's:  Six Dimensions of Multidimensional Assessment  Dimension 1:  Acute Intoxication and/or Withdrawal Potential:       Dimension 2:  Biomedical Conditions and Complications:      Dimension 3:  Emotional, Behavioral, or Cognitive Conditions and Complications:     Dimension 4:  Readiness to Change:     Dimension 5:  Relapse, Continued use, or Continued Problem Potential:     Dimension 6:  Recovery/Living Environment:     ASAM Severity Score:    ASAM Recommended Level of Treatment:     Substance use Disorder (SUD)    Recommendations for Services/Supports/Treatments:    DSM5 Diagnoses: Patient Active Problem List   Diagnosis Date Noted   MDD (major depressive disorder), recurrent episode,  severe (HCC) 03/30/2023   GAD (generalized anxiety disorder) 03/30/2023    Patient Centered Plan: Patient is on the following Treatment Plan(s):  Depression   Referrals to Alternative Service(s): Referred to Alternative Service(s):   Place:   Date:   Time:    Referred to Alternative Service(s):   Place:   Date:   Time:    Referred to Alternative Service(s):   Place:   Date:   Time:    Referred to Alternative Service(s):   Place:   Date:   Time:      Collaboration of Care: Other provider involved in patient's care AEB referred by [facility]  Patient/Guardian was advised Release of Information must be obtained prior to any record release in order to collaborate their care with an outside provider. Patient/Guardian was advised if they have not already done so to contact the registration department to sign all necessary forms in order for [place] to release information regarding their care.   Consent: Patient/Guardian gives verbal consent for treatment and assignment of benefits for services provided during this visit. Patient/Guardian expressed understanding and agreed to proceed.   Wyvonnia Lora, LCSW

## 2023-03-31 ENCOUNTER — Other Ambulatory Visit (HOSPITAL_COMMUNITY): Payer: PRIVATE HEALTH INSURANCE

## 2023-04-01 ENCOUNTER — Other Ambulatory Visit (HOSPITAL_COMMUNITY): Payer: PRIVATE HEALTH INSURANCE

## 2023-04-04 ENCOUNTER — Other Ambulatory Visit (HOSPITAL_COMMUNITY): Payer: PRIVATE HEALTH INSURANCE | Attending: Psychiatry | Admitting: Licensed Clinical Social Worker

## 2023-04-04 ENCOUNTER — Other Ambulatory Visit (HOSPITAL_COMMUNITY): Payer: PRIVATE HEALTH INSURANCE | Attending: Psychiatry

## 2023-04-04 DIAGNOSIS — E0500 Thyrotoxicosis with diffuse goiter without thyrotoxic crisis or storm: Secondary | ICD-10-CM | POA: Insufficient documentation

## 2023-04-04 DIAGNOSIS — F419 Anxiety disorder, unspecified: Secondary | ICD-10-CM | POA: Diagnosis present

## 2023-04-04 DIAGNOSIS — F1290 Cannabis use, unspecified, uncomplicated: Secondary | ICD-10-CM | POA: Insufficient documentation

## 2023-04-04 DIAGNOSIS — R4589 Other symptoms and signs involving emotional state: Secondary | ICD-10-CM | POA: Insufficient documentation

## 2023-04-04 DIAGNOSIS — F332 Major depressive disorder, recurrent severe without psychotic features: Secondary | ICD-10-CM

## 2023-04-04 DIAGNOSIS — F39 Unspecified mood [affective] disorder: Secondary | ICD-10-CM | POA: Insufficient documentation

## 2023-04-04 DIAGNOSIS — F411 Generalized anxiety disorder: Secondary | ICD-10-CM | POA: Insufficient documentation

## 2023-04-04 MED ORDER — QUETIAPINE FUMARATE 50 MG PO TABS: 100.0000 mg | ORAL_TABLET | Freq: Every day | ORAL | 0 refills | Status: DC

## 2023-04-04 NOTE — Progress Notes (Signed)
Virtual Visit via Video Note  I connected with Alyssa Lutz on 04/04/23 at  9:00 AM EDT by a video enabled telemedicine application and verified that I am speaking with the correct person using two identifiers.  Location: Patient: Home Provider: Office   I discussed the limitations of evaluation and management by telemedicine and the availability of in person appointments. The patient expressed understanding and agreed to proceed.    I discussed the assessment and treatment plan with the patient. The patient was provided an opportunity to ask questions and all were answered. The patient agreed with the plan and demonstrated an understanding of the instructions.   The patient was advised to call back or seek an in-person evaluation if the symptoms worsen or if the condition fails to improve as anticipated.     Bobbye Morton, MD   Psychiatric Initial Adult Assessment   Patient Identification: Alyssa Lutz MRN:  [id number] Date of Evaluation:  04/04/2023 Referral Source: [facility] Chief Complaint:   Chief Complaint  Patient presents with   Anxiety   Depression   Visit Diagnosis:    ICD-10-CM   1. Unspecified mood (affective) disorder (HCC)  F39 QUEtiapine (SEROQUEL) 50 MG tablet    2. Cannabis use disorder  F12.90     3. GAD (generalized anxiety disorder)  F41.1       History of Present Illness:  Alyssa Lutz is a 42 y.o. female w/ PPH of bipolar 2 d/o vs MDD and anxiety and PMH of Graves Dx and lactation induced-osteoporosis. Patient has 1 prior SA with BH hospitalization. Current medication regimen: Seroquel 25mg  QHS  Patient reports that she has been struggling with intrusive thoughts of self harm, irritable, and difficulty concentrating over the last month. Patient reports that she feels like her thoughts are rapid and tangential and she also feels much more anxious. Patient is really upset with herself due to her anger outburst and is scaring her son. Her husband is really  concerned. Patient reports that she said really mean things after her sons bday party and she is more on edge.   Patient endorses multiple stressors. Including the recent merger at work with [employer], where as a result she was demoted. She does not like her new job in [employer]. She does not feel like she is getting much support. She does not like the current company. This has been going on since 09/2022.   Patient reports that she chronically struggles with reaching out to her family despite seeing them frequently. Patient reports that she has been trying to hide the emotions she is having, that are scaring her. Patient reports that she tends to isolate herself when going through trouble.   Patient reports her biggest concern is her labile emotions and irritability. Patient reports that she started becoming concerned about 1 month ago. Patient reports that she has tried to take Valium and she thinks it makes her feel worse and she feels more disinhibited in the moment. Patient reports that she is scared by her thoughts. Patient reports that she is feels useless and hopelessness daily. Patient reports that prior to 09/2022 they were not daily, but since the job issues it has become more intrusive and frequent. She is scared by her inability to control them as well. Patient reports that in the last 2-3 weeks, she made the mistake in one of her explosive events, of saying she wanted to kill herself in front of her son, which led to him crying.  Patient reports that she agrees with her husband that that was too far and different than normal. Patient reports that she has never hurt anyone else or herself in these rages, physically. Patient reports that the more recent events have led to her feeling like she could harm herself impulsively if they keep occurring. She used a cold shower as a coping skill if the most recent event to keep from harming herself.   Patient reports that she has periods where she will  having hypersomnia and feeling depressed and that life isn't worth living and she is more tearful. Patient reports that she will isolate and this may happened for 1 week. Patient reports that sometimes these end she feels normally happt, but she also thinks that she may be having hypomania at times. Patient reports that she will spend a few hundred dollars buying things for her son and not things that anyone needs. Patient reports that she will often find herself returning these things right after they arrive. Patient reports that she feels like she buys things impulsively. Patient reports that she may clean or organize more, and her husband will make comments. Patient will start opening old boxes in the attic, but never actually clean or do anything. Her cleaning is not intentioned, she is just enjoying having energy. Currently patient feels like she is speaking faster than normal and her thoughts are going too fast.  Patient reports that she does not sleep well at baseline. She does realize that taking Adderall at 4 pm may have contributed to this, but her last dose of Adderall was about 1 month ago. She did start to feel more irritable on the Adderall, but stopping it she still had the irritability. She had been on the Adderall since college, she had some periods where she was off such as pregnancy, but has gong back and forth due to issues with concentration. Patient reports that she like to journal and right poetry at night, which is when she feels most peaceful and has time to herself. Patient reports that she does enjoy this, but it leads to her going to sleep at 12 AM. Patient reports that lately she is having night time awakening and waking up at 3AM and then again at 6AM. Patient reports that she is waking up sometimes crying. Patient endorses she feels more often down and sad with irrtitability. Patient endorses anhedonia, even time with her son. This has been going on slowly the last year but most  intesnly the last month. Patient reports poor concentration and a lot of "mom guilt." Patient repots she feels her depressive episodes are increasing in frequency and less controlled. Patient reports that she is struggling at work. Patient reports that her appetite is fluctuating. Patient reports that she feels currently, she is eating more but no significant weight changes.   Patient reports that she feels like she spends more than half of her time worrying and constantly feels like something will go wrong at all times. Patient reports that she has somatization of symptoms, but lately she has been feeling restless with rapid heart beat and racing thoughts. Patient does not think she has panic attacks. Patient does feel like he anxiety can keep her from functioning due to her ruminating and negative thought process.   Patient denies active  and passive SI. Patient endorses her son as a protective factor. Patient report that she did have passive SI over the weekend. Patient reports she has chronic infrequent passive SI. Patient denies  HI and AVH.  Patient denies paranoia and  delusions. No access to guns or pills.   Patient denies trauma hx.  Associated Signs/Symptoms: Depression Symptoms:  Dysphoric mood, Irritability, Insomnia, Appetite change, Frequent SI (Hypo) Manic Symptoms:  Distractibility, Flight of Ideas, Impulsivity, Irritable Mood, Labiality of Mood, Anxiety Symptoms:  Excessive Worry, Panic Symptoms, Psychotic Symptoms:   denies PTSD Symptoms: NA  Past Psychiatric History:  Previous dx: MDD, possible Bipolar 2 d/o, ADHD dx in college by college clinic provider INPT: 1x in 2004 while in college due to SA, where she Od'd on klonopin after breakup with boyfriend. Did not require medical hospitalization due to throwing up pills. Was prescribed Prozac or Lexapro at that time.  Dc'd from hospital Seroquel, Trazodone, trileptal, and klonopin.  Previous meds: [Lexapro, Cymbalta, Prozac,  Wellbutrin]- recalls during the 10 years she was on these meds she would often hear from others that she had too much energy, had odd cleaning patterns late at night, more suicidal, and excessive shopping), Adderall Lamictal (ok, but poor compliance), trazodone, Valium, and Seroquel (helpful), Abilfy (failed had cognitive impairment) Outpatient: Previously at [facility] (due to insurance) and previous therapy No self harm hx Previous Psychotropic Medications: Yes   Substance Abuse History in the last 12 months:  Yes.    Etoh: rare, maybe 1x/ year, makes her more depressed THC: hx of use throughout adult life, began using more frequently the last 5 years and the last 1 year. She last uesd about 3 weeks ago. She  is having a harder time for this first time w/o THC and is scared by this. Patient was using [employer] products daily in vaping pen. Husband has since thrown away [facility].  Denies other illicit subtances No hx of rehab  Consequences of Substance Abuse: Negative  Past Medical History:  Past Medical History:  Diagnosis Date   ADHD    Anxiety    Depression    Graves disease    History of spinal fracture     Past Surgical History:  Procedure Laterality Date   APPENDECTOMY     CESAREAN SECTION      Family Psychiatric History:  Brother: Bipolar 1 d/o (hx of Lithium but had toxicity and is currently on Depakote and stable) Sister: Bipolar 2 on Abilify M uncle: suicide in [place] M aunt: institionalized whole life, (unsure why) in [place]  Family History: No family history on file.  Social History:   Social History   Socioeconomic History   Marital status: Divorced    Spouse name: Not on file   Number of children: Not on file   Years of education: Not on file   Highest education level: Not on file  Occupational History   Not on file  Tobacco Use   Smoking status: Never   Smokeless tobacco: Never  Vaping Use   Vaping status: Never Used  Substance and Sexual Activity    Alcohol use: Not Currently   Drug use: Never   Sexual activity: Yes  Other Topics Concern   Not on file  Social History Narrative   Not on file   Social Determinants of Health   Financial Resource Strain: Not on file  Food Insecurity: Not on file  Transportation Needs: Not on file  Physical Activity: Not on file  Stress: Not on file  Social Connections: Not on file    Additional Social History:  - Sister is a Engineering geologist -is a Estate manager/land agent works for [employer] (not enjoying [place]) - Married with  71 yo  - Support system: son, husband, close friends - family is in the [place] and [place] area - family for [place] and [place]  Allergies:  No Known Allergies  Metabolic Disorder Labs: No results found for: "HGBA1C", "MPG" No results found for: "PROLACTIN" No results found for: "CHOL", "TRIG", "HDL", "CHOLHDL", "VLDL", "LDLCALC" No results found for: "TSH"  Therapeutic Level Labs: No results found for: "LITHIUM" No results found for: "CBMZ" No results found for: "VALPROATE"  Current Medications: Current Outpatient Medications  Medication Sig Dispense Refill   [START ON 04/06/2023] QUEtiapine (SEROQUEL) 50 MG tablet Take 2 tablets (100 mg total) by mouth at bedtime. 14 tablet 0   BALZIVA 0.4-35 MG-MCG tablet Take 1 tablet by mouth daily.     benzonatate (TESSALON) 100 MG capsule Take 1 capsule (100 mg total) by mouth 3 (three) times daily as needed for cough. Do not take with alcohol or while driving or operating heavy machinery. May cause drowsiness. 21 capsule 0   calcium citrate (CALCITRATE - DOSED IN MG ELEMENTAL CALCIUM) 950 (200 Ca) MG tablet Take by mouth.     Cholecalciferol (D 1000) 25 MCG (1000 UT) capsule Take by mouth.     diazepam (VALIUM) 2 MG tablet Take 1 tablet (2 mg total) by mouth daily as needed.     fluticasone (FLONASE) 50 MCG/ACT nasal spray Place 1 spray into both nostrils 2 (two) times daily. 16 g 2   methimazole (TAPAZOLE) 5 MG tablet Take 1 tablet (5  mg total) by mouth daily.     Multiple Vitamin (MULTIVITAMIN) capsule multivitamin     Vitamin D, Ergocalciferol, (DRISDOL) 1.25 MG (50000 UNIT) CAPS capsule Take by mouth.     No current facility-administered medications for this visit.      Psychiatric Specialty Exam: Review of Systems  Psychiatric/Behavioral:  Positive for agitation, behavioral problems, decreased concentration, dysphoric mood and sleep disturbance. Negative for hallucinations and suicidal ideas. The patient is nervous/anxious.     There were no vitals taken for this visit.There is no height or weight on file to calculate BMI.  General Appearance: Fairly Groomed  Eye Contact:  Fair  Speech:  Pressured  Volume:  Normal  Mood:  Anxious  Affect:  Congruent  Thought Process:  Goal Directed   Orientation:  Full (Time, Place, and Person)  Thought Content:  Tangential   Suicidal Thoughts:  No currently  Homicidal Thoughts:  No  Memory:  Immediate;   Good Recent;   Good  Judgement:  Fair  Insight:  Fair  Psychomotor Activity:  Restlessness  Concentration:  Concentration: Fair  Recall:  Good  Fund of Knowledge:Good  Language: Good  Akathisia:  Yes  Handed:    AIMS (if indicated):  not done  Assets:  Communication Skills Desire for Improvement Housing Leisure Time Resilience Social Support Vocational/Educational  ADL's:  Intact  Cognition: WNL  Sleep:  Poor   Screenings: GAD-7    Advertising copywriter from [facility]/30/2023 [facility]  Total GAD-7 Score 20      PHQ2-9    Flowsheet Row Counselor from 03/30/2023 in [facility]OSPITALIZATION PROGRAM  PHQ-2 Total Score 5  PHQ-9 Total Score 21      Flowsheet Row Counselor from 03/30/2023 in [facility] PART[facility]OSPITALIZATION PROGRAM ED from 03/29/2023 in [facility] ED from 10/11/2022 in [facility] RISK CATEGORY High Risk No Risk No  Risk  Assessment and Plan:  Patient is endorsing significant hx of mood lability and irritability. While she does endorse more frequent THC use in the last few months, this may have contributed to worsened anxiety and irritability; it does appear that patient has a hx of unstable mood without concurrent substance use. Patient is able to recall behaviors that resembled her siblings manic and hypomanic episodes, when she was in college and recalls others saying something. Patient is fully aware that she was in denial at the time and ignored other's concerns about her behaviors. Patient's FH increase risk for true Bipolar d/o. She also recalls have increased impulsivity/ disinhibition with SSRIs and BZD's, she feels similarly with Etoh intake, again supporting likely bipolar 2 d/o dx.  It is a bit difficult to flesh out the time period that hypomanic episodes last, but on assessment today, it appears patient may have about 4 days of feeling better than normal and having more energy with less rest. It is easier for patient to recall her depressive episodes.It does appear that patient has had true MDD, therefore do not think Cyclothymic d/o is appropriate. At this time, will dx patient with unspecified mood d/o.  Patient is anxious about medications, will try to treat with monotherapy, using Seroquel alone. Did discuss side effect risks (including HLD, T2DM, and sedation) and future monitoring. Also discussed the potential for lamictal or Trileptal in the future if not able to stabilize on monotherapy.   Unspecified mood d/o ( R/o Bipolar 2 d/o) - Increase Seroquel to 50mg  at bedtime x 2 days then increase to 100mg  at bedtime  Cannabis use - Patient stopped 3 weeks ago, continue abstinence  Collaboration of Care:   Patient/Guardian was advised Release of Information must be obtained prior to any record release in order to collaborate their care with an outside provider. Patient/Guardian was advised  if they have not already done so to contact the registration department to sign all necessary forms in order for [place] to release information regarding their care.   Consent: Patient/Guardian gives verbal consent for treatment and assignment of benefits for services provided during this visit. Patient/Guardian expressed understanding and agreed to proceed.    PGY-4 Bobbye Morton, MD 8/12/20242:46 PM

## 2023-04-05 ENCOUNTER — Other Ambulatory Visit (HOSPITAL_COMMUNITY): Payer: PRIVATE HEALTH INSURANCE

## 2023-04-05 ENCOUNTER — Other Ambulatory Visit (HOSPITAL_COMMUNITY): Payer: PRIVATE HEALTH INSURANCE | Admitting: Licensed Clinical Social Worker

## 2023-04-05 DIAGNOSIS — F411 Generalized anxiety disorder: Secondary | ICD-10-CM

## 2023-04-05 DIAGNOSIS — R4589 Other symptoms and signs involving emotional state: Secondary | ICD-10-CM

## 2023-04-05 DIAGNOSIS — F39 Unspecified mood [affective] disorder: Secondary | ICD-10-CM | POA: Diagnosis not present

## 2023-04-05 DIAGNOSIS — F332 Major depressive disorder, recurrent severe without psychotic features: Secondary | ICD-10-CM

## 2023-04-06 ENCOUNTER — Other Ambulatory Visit (HOSPITAL_COMMUNITY): Payer: PRIVATE HEALTH INSURANCE | Admitting: Licensed Clinical Social Worker

## 2023-04-06 ENCOUNTER — Encounter (HOSPITAL_COMMUNITY): Payer: Self-pay

## 2023-04-06 ENCOUNTER — Other Ambulatory Visit (HOSPITAL_COMMUNITY): Payer: PRIVATE HEALTH INSURANCE

## 2023-04-06 DIAGNOSIS — F332 Major depressive disorder, recurrent severe without psychotic features: Secondary | ICD-10-CM

## 2023-04-06 DIAGNOSIS — F39 Unspecified mood [affective] disorder: Secondary | ICD-10-CM | POA: Diagnosis not present

## 2023-04-06 DIAGNOSIS — F411 Generalized anxiety disorder: Secondary | ICD-10-CM

## 2023-04-06 DIAGNOSIS — E0500 Thyrotoxicosis with diffuse goiter without thyrotoxic crisis or storm: Secondary | ICD-10-CM | POA: Insufficient documentation

## 2023-04-06 NOTE — Therapy (Signed)
[facility] PARTIAL HOSPITALIZATION PROGRAM [address] [place], [place], [address] Phone: [phone]   Fax:  [phone]  Occupational Therapy Treatment Virtual Visit via Video Note  I connected with Alyssa Lutz on 04/06/23 at  8:00 AM EDT by a video enabled telemedicine application and verified that I am speaking with the correct person using two identifiers.  Location: Patient: home Provider: office   I discussed the limitations of evaluation and management by telemedicine and the availability of in person appointments. The patient expressed understanding and agreed to proceed.    The patient was advised to call back or seek an in-person evaluation if the symptoms worsen or if the condition fails to improve as anticipated.  I provided 55 minutes of non-face-to-face time during this encounter.   Patient Details  Name: Alyssa Lutz MRN: [id number] Date of Birth: Feb 28, 1981 No data recorded  Encounter Date: 04/05/2023   OT End of Session - 04/06/23 1328     Visit Number 2    Number of Visits 20    Date for OT Re-Evaluation 05/06/23    OT Start Time 1200    OT Stop Time 1255    OT Time Calculation (min) 55 min             Past Medical History:  Diagnosis Date   ADHD    Anxiety    Depression    Graves disease    History of spinal fracture     Past Surgical History:  Procedure Laterality Date   APPENDECTOMY     CESAREAN SECTION      There were no vitals filed for this visit.   Subjective Assessment - 04/06/23 1327     Currently in Pain? No/denies    Pain Score 0-No pain                 Group Session:  S: Feeling a bit better today I believe.   O: The primary objective of this topic is to explore and understand the concept of occupational balance in the context of daily living. The term "occupational balance" is defined broadly, encompassing all activities that occupy an individual's time and energy, including  self-care, leisure, and work-related tasks. The goal is to guide participants towards achieving a harmonious blend of these activities, tailored to their personal values and life circumstances. This balance is aimed at enhancing overall well-being, not by equally distributing time across activities, but by ensuring that daily engagements are fulfilling and not draining. The content delves into identifying various barriers that individuals face in achieving occupational balance, such as overcommitment, misaligned priorities, external pressures, and lack of effective time management. The impact of these barriers on occupational performance, roles, and lifestyles is examined, highlighting issues like reduced efficiency, strained relationships, and potential health problems. Strategies for cultivating occupational balance are a key focus. These strategies include practical methods like time blocking, prioritizing tasks, establishing self-care rituals, decluttering, connecting with nature, and engaging in reflective practices. These approaches are designed to be adaptable and applicable to a wide range of life scenarios, promoting a proactive and mindful approach to daily living. The overall aim is to equip participants with the knowledge and tools to create a balanced lifestyle that supports their mental, emotional, and physical health, thereby improving their functional performance in daily life.   A:  The patient demonstrated a high level of engagement and active participation throughout the session on occupational balance. The patient frequently contributed to discussions,  offering insightful reflections on personal experiences related to the barriers and strategies for achieving occupational balance. There was a clear understanding of the concept and an ability to relate it to their own life. The patient showed enthusiasm in learning and applying the strategies discussed, such as time blocking and self-care  rituals, indicating a strong motivation to improve their occupational balance. The patient's proactive approach and responsiveness to the topic suggest a high potential for implementing these strategies effectively in their daily routine.    P: Continue to attend PHP OT group sessions 5x week for 4 weeks to promote daily structure, social engagement, and opportunities to develop and utilize adaptive strategies to maximize functional performance in preparation for safe transition and integration back into school, work, and the community. Plan to address topic of pt 2 in next OT group session.                  OT Education - 04/06/23 1327     Education Details Occupational Balance 1              OT Short Term Goals - 04/06/23 0840       OT SHORT TERM GOAL #1   Title Client will develop and utilize a personalized coping toolbox containing at least five coping strategies to manage challenging situations, demonstrating their use in real-life scenarios by the end of therapy.    Time 4    Period Weeks    Status On-going    Target Date 05/06/23      OT SHORT TERM GOAL #2   Title By discharge, client will demonstrate the ability to adapt and modify routines when faced with unexpected events or changes, maintaining a balanced approach to daily tasks.    Status On-going      OT SHORT TERM GOAL #3   Title By the time of discharge, client will independently set, track, and make progress towards a long-term goal, demonstrating resilience in overcoming obstacles and seeking support when needed.    Status On-going                      Plan - 04/06/23 1328     Psychosocial Skills Interpersonal Interaction;Routines and Behaviors;Habits;Coping Strategies             Patient will benefit from skilled therapeutic intervention in order to improve the following deficits and impairments:       Psychosocial Skills: Interpersonal Interaction, Routines and Behaviors,  Habits, Coping Strategies   Visit Diagnosis: Difficulty coping    Problem List Patient Active Problem List   Diagnosis Date Noted   Graves disease 04/06/2023   Unspecified mood (affective) disorder (HCC) 04/04/2023   MDD (major depressive disorder), recurrent episode, severe (HCC) 03/30/2023   GAD (generalized anxiety disorder) 03/30/2023    Ted Mcalpine, OT 04/06/2023, 1:29 PM  Kerrin Champagne, OT   [facility] HOSPITALIZATION PROGRAM [address] [place], [place], [address] Phone: [phone]   Fax:  [phone]  Name: Alyssa Lutz MRN: [id number] Date of Birth: 10-10-1980

## 2023-04-06 NOTE — Progress Notes (Signed)
BH MD/PA/NP OP Progress Note  Virtual Visit via Telephone Note  I connected with Alyssa Lutz on 04/06/23 at  9:00 AM EDT by a video enabled telemedicine application and verified that I am speaking with the correct person using two identifiers.  Location: Patient: Home Provider: Office   I discussed the limitations, risks, security and privacy concerns of performing an evaluation and management service by telephone and the availability of in person appointments. I also discussed with the patient that there may be a patient responsible charge related to this service. The patient expressed understanding and agreed to proceed.   I discussed the assessment and treatment plan with the patient. The patient was provided an opportunity to ask questions and all were answered. The patient agreed with the plan and demonstrated an understanding of the instructions.   The patient was advised to call back or seek an in-person evaluation if the symptoms worsen or if the condition fails to improve as anticipated.   Princess Bruins, DO Psych Resident, PGY-3   Name: Alyssa Lutz  MRN:  [id number]  Chief Complaint:  Chief Complaint  Patient presents with   Anxiety   HPI: [name] is a 42 y.o. female with PMH of MDD, possible Bipolar 2 d/o, suicide attempt, inpatient psych admission, Graves Disease (euthymic, on tapazole) who was admitted to [facility] for increased mood lability and chronic passive SI becoming more intrusive.   Concerns: she has concerns about increasing seroquel to 100 mg, she would like to go up to 75 mg instead of 100 mg due to fear oversedation during the daytime.  Stated that she felt "drunk" this AM. Stated that this morning she felt groggy her than she did earlier this week.  Initially she contributed this to the Seroquel, however noted that she did not feel sedated after 30 minutes to 1 hour after taking the medication.  After further discussion, she attributed her grogginess from  difficulties falling asleep due to looking up medication side effects and napping for about 3 hours yesterday.  Discussed sleep hygiene, and avoiding naps during the day. She also brought up multiple antiepileptics for mood stabilization, specifically Topamax, stated that she feels that this would be helpful for mood as well as her chronic pain.  Want to get PCP -does not have currently Wants labs - CBC, CMP, A1c, lipid panel, TSH, T4, T3  Mood: worried A bit cranky - feel a bit more cranky than yesterday, due to less sleep.   Sleep: difficulties falling asleep -staying up for a few hours per above  Appetite: Intact  Safety: Denied active and passive SI, HI, AVH, paranoia.   Review of Systems  Respiratory:  Negative for shortness of breath.   Cardiovascular:  Negative for chest pain.  Gastrointestinal:  Negative for nausea and vomiting.  Neurological:  Negative for dizziness and headaches.     Past Psychiatric History:  Previous dx: MDD, possible Bipolar 2 d/o, ADHD dx in college by college clinic provider INPT: 1x in 2004 while in college due to SA, where she Od'd on klonopin after breakup with boyfriend. Did not require medical hospitalization due to throwing up pills. Was prescribed Prozac or Lexapro at that time.  Dc'd from hospital Seroquel, Trazodone, trileptal, and klonopin.  Previous meds: [Lexapro, Cymbalta, Prozac, Wellbutrin]- recalls during the 10 years she was on these meds she would often hear from others that she had too much energy, had odd cleaning patterns late at night, more suicidal, and excessive  shopping), Adderall Lamictal (ok, but poor compliance), trazodone, Valium, and Seroquel (helpful), Abilfy (failed had cognitive impairment) Outpatient: Previously at [facility] (due to insurance) and previous therapy No self harm hx  Etoh: rare, maybe 1x/ year, makes her more depressed THC: hx of use throughout adult life, began using more frequently the last 5 years and  the last 1 year. She last uesd about 3 weeks ago. She  is having a harder time for this first time w/o THC and is scared by this. Patient was using Delta products daily in vaping pen. Husband has since thrown away [facility].  Denies other illicit subtances No hx of rehab  Family Psychiatric History:  Brother: Bipolar 1 d/o (hx of Lithium but had toxicity and is currently on Depakote and stable) Sister: Bipolar 2 on Abilify M uncle: suicide in [place] M aunt: institionalized whole life, (unsure why) in [place]  Additional Social History:  - Sister is a Engineering geologist -is a Estate manager/land agent works for [employer] (not enjoying [place]) - Married with 6 yo  - Support system: son, husband, close friends - family is in the [place] and [place] area - family from [place] and [place]  Visit Diagnosis:    ICD-10-CM   1. Unspecified mood (affective) disorder (HCC)  F39 Hemoglobin A1c    Comprehensive metabolic panel    CBC with Differential/Platelet    Ambulatory referral to [facility]    TSH+T4F+T3Free    Lipid panel    2. GAD (generalized anxiety disorder)  F41.1     3. Graves disease  E05.00 Comprehensive metabolic panel    CBC with Differential/Platelet    [facility] [place] [facility] [place]      Past Medical History:  Past Medical History:  Diagnosis Date   ADHD    Anxiety    Depression    Graves disease    History of spinal fracture     Past Surgical History:  Procedure Laterality Date   APPENDECTOMY     CESAREAN SECTION      Family History:  No family history on file.  Social History:  Social History   Socioeconomic History   Marital status: Divorced    Spouse name: Not on file   Number of children: Not on file   Years of education: Not on file   Highest education level: Not on file  Occupational History   Not on file  Tobacco Use   Smoking status: Never   Smokeless tobacco: Never  Vaping Use   Vaping status: Never Used  Substance and  Sexual Activity   Alcohol use: Not Currently   Drug use: Never   Sexual activity: Yes  Other Topics Concern   Not on file  Social History Narrative   Not on file   Social Determinants of Health   Financial Resource Strain: Not on file  Food Insecurity: Not on file  Transportation Needs: Not on file  Physical Activity: Not on file  Stress: Not on file  Social Connections: Not on file    Allergies:  No Known Allergies  Metabolic Disorder Labs: No results found for: "HGBA1C", "MPG" No results found for: "PROLACTIN" No results found for: "CHOL", "TRIG", "HDL", "CHOLHDL", "VLDL", "LDLCALC" No results found for: "TSH"  Therapeutic Level Labs: No results found for: "LITHIUM" No results found for: "VALPROATE" No results found for: "CBMZ"  Current Medications: Current Outpatient Medications  Medication Sig Dispense Refill   BALZIVA 0.4-35 MG-MCG tablet Take 1 tablet by mouth daily.  benzonatate (TESSALON) 100 MG capsule Take 1 capsule (100 mg total) by mouth 3 (three) times daily as needed for cough. Do not take with alcohol or while driving or operating heavy machinery. May cause drowsiness. 21 capsule 0   calcium citrate (CALCITRATE - DOSED IN MG ELEMENTAL CALCIUM) 950 (200 Ca) MG tablet Take by mouth.     Cholecalciferol (D 1000) 25 MCG (1000 UT) capsule Take by mouth.     diazepam (VALIUM) 2 MG tablet Take 1 tablet (2 mg total) by mouth daily as needed.     fluticasone (FLONASE) 50 MCG/ACT nasal spray Place 1 spray into both nostrils 2 (two) times daily. 16 g 2   methimazole (TAPAZOLE) 5 MG tablet Take 1 tablet (5 mg total) by mouth daily.     Multiple Vitamin (MULTIVITAMIN) capsule multivitamin     QUEtiapine (SEROQUEL) 50 MG tablet Take 2 tablets (100 mg total) by mouth at bedtime. 14 tablet 0   Vitamin D, Ergocalciferol, (DRISDOL) 1.25 MG (50000 UNIT) CAPS capsule Take by mouth.     No current facility-administered medications for this visit.   Psychiatric Specialty  Exam: There were no vitals taken for this visit.  There is no height or weight on file to calculate BMI.  General Appearance: Casual, faily groomed  Eye Contact:  Good    Speech:  Clear, coherent, normal rate, hyperverbal  Volume:  Normal   Mood:  "Worried"  Affect:  Appropriate, congruent, full range  Thought Content: Logical, rumination  Suicidal Thoughts: Denied active and passive SI    Thought Process:  Coherent, goal-directed, linear   Orientation:  A&Ox4   Memory:  Immediate good  Judgment:  Fair   Insight:  Fair   Concentration:  Attention and concentration good   Recall:  Good  Fund of Knowledge: Good  Language: Good, fluent  Psychomotor Activity: Grossly normal, limited by virtual encounter, possibly increased due to fidgeting from anxiety  Akathisia:  NA   AIMS (if indicated): NA   Assets:  Communication Skills Desire for Improvement Financial Resources/Insurance Housing Intimacy Leisure Time Physical Health Resilience Social Support Talents/Skills Transportation Vocational/Educational  ADL's:  Intact  Cognition: WNL  Sleep:  Fair     Screenings: GAD-7    Advertising copywriter from 03/30/2023 in [facility]PITALIZ[facility] PROGRAM  Total GAD-7 Score 20      PHQ2-9    Flowsheet Row Counselor from 03/30/2023 in [facility] PARTIAL HOSPITALIZATION PROGRAM  PHQ-2 Total Score 5  PHQ-9 Total Score 21      Flowsheet Row Counselor from 03/30/2023 in [facility]AL [facility] PARTIAL HOSPITALIZATION PROGRAM ED from 03/29/2023 in [facility] ED from 10/11/2022 in [facility] [place]  C-SSRS RISK CATEGORY High Risk No Risk No Risk       Assessment and Plan:   Unspecified mood disorder (rule out bipolar 2 disorder) Main symptoms of concern are irritability, mood lability, and acute change in chronic passive SI to now more intrusive.  Unclear etiology, suspect this is due to increased cannabis use.   Strong family history of bipolar spectrum disorder. INCREASED Seroquel 50 mg to 75 mg at bedtime, then 100 mg daily on Friday night  Cannabis use Last time used was about 3 weeks ago. Encouraged cessation  Graves' disease Followed by endocrinologist in [facility] Continued home Tapazole Ambulatory PCP referral-[facility] Labs: CBC, CMP, TSH, free T4, free T3, lipid panel, A1c  Moderate VitD insufficiency Repletion per endocrinologist  Collaboration of Care: Patient  is to continue therapy and follow-up with their outpatient provider  Patient/Guardian was advised Release of Information must be obtained prior to any record release in order to collaborate their care with an outside provider. Patient/Guardian was advised if they have not already done so to contact the registration department to sign all necessary forms in order for [place] to release information regarding their care.   Consent: Patient/Guardian gives verbal consent for treatment and assignment of benefits for services provided during this visit. Patient/Guardian expressed understanding and agreed to proceed.    Princess Bruins, DO Psych Resident, PGY-3 04/06/2023, 10:48 AM

## 2023-04-06 NOTE — Therapy (Signed)
[facility] PARTIAL HOSPITALIZATION PROGRAM [address] [place], [place], [address] Phone: [phone]   Fax:  [phone]  Occupational Therapy Evaluation Virtual Visit via Video Note  I connected with Alyssa Lutz on 04/06/23 at  8:00 AM EDT by a video enabled telemedicine application and verified that I am speaking with the correct person using two identifiers.  Location: Patient: home Provider: office   I discussed the limitations of evaluation and management by telemedicine and the availability of in person appointments. The patient expressed understanding and agreed to proceed.    The patient was advised to call back or seek an in-person evaluation if the symptoms worsen or if the condition fails to improve as anticipated.  I provided 85 minutes of non-face-to-face time during this encounter.   Patient Details  Name: Alyssa Lutz MRN: [id number] Date of Birth: 1980/11/13 No data recorded  Encounter Date: 04/04/2023   OT End of Session - 04/06/23 0838     Visit Number 1    Number of Visits 20    Date for OT Re-Evaluation 05/06/23    OT Start Time 0930    OT Stop Time 1255    OT Time Calculation (min) 205 min    Activity Tolerance Patient tolerated treatment well    Behavior During Therapy [facility] for tasks assessed/performed             Past Medical History:  Diagnosis Date   ADHD    Anxiety    Depression    Graves disease    History of spinal fracture     Past Surgical History:  Procedure Laterality Date   APPENDECTOMY     CESAREAN SECTION      There were no vitals filed for this visit.   Subjective Assessment - 04/06/23 0834     Subjective  "Hoping to learn new coping skills while I am here"    Pertinent History MDD, GAD    Limitations psychosocial deficits that inhibit occupational performance    Patient Stated Goals improve coping skills    Currently in Pain? Yes    Pain Score 4     Pain Location Back    Pain  Orientation Lower    Pain Descriptors / Indicators Discomfort    Pain Type Chronic pain    Pain Onset More than a month ago    Pain Frequency Intermittent    Aggravating Factors  activity, stress    Effect of Pain on Daily Activities inhibits dialy participation in tasks / decreased motivation    Multiple Pain Sites No                  OT Assessment  Diagnosis: MDD, GAD Past medical history/referral information: chronic pain  Living situation: family ADLs: Ind Work: FMLA Leisure: inhibited Social support:  fair Struggles: Pharmacologist / motivation / pain (chronic) OT goal:  improve inhibited psychosocial domains in order to allow for improved occupational performance.   OCAIRS Mental Health Interview Summary of Client Scores:  Facilitates participation in occupation Allows participation in occupation Inhibits participation in occupation Restricts participation in occupation Comments:  Roles    X   Habits   X    Personal Causation   X    Values   X    Interests    X   Skills   X    Short-Term Goals  X     Long-term Goals  X     Interpretation  of Past Experiences   X    Physical Environment X      Social Environment  X     Readiness for Change  X       Need for Occupational Therapy:  4 Shows positive occupational participation, no need for OT.   3 Need for minimal intervention/consultative participation   2 Need for OT intervention indicated to restore/improve participation   1 Need for extensive OT intervention indicated to improve participation.  Referral for follow up services also recommended.   Assessment:  Patient demonstrates behavior that INHIBITS participation in occupation.  Patient will benefit from occupational therapy intervention in order to improve time management, financial management, stress management, job readiness skills, social skills, and health management skills in preparation to return to full time community living and to be a productive  community member.    Plan:  Patient will participate in skilled occupational therapy sessions individually or in a group setting to improve coping skills, psychosocial skills, and emotional skills required to return to prior level of function. Treatment will be 4-5 times per week for 4 weeks.     Group Session:   O: During today's OT group session, the patient participated in an educational segment about the importance of goal-setting and the application of the SMART framework to enhance daily life, particularly focusing on ADLs and iADLs. The session began with five open-ended pre-session questions that facilitated group discussion and introspection about their current relationship with goals. Following the introduction and educational segment, participants engaged in brainstorming and group discussions to devise hypothetical SMART goals. The session concluded with five post-session questions to reinforce understanding and facilitate reflection. Throughout the session, there was a range of engagement levels noted among the participants.   A:  Patient demonstrated a high level of engagement throughout the session. They actively participated in discussions, sharing personal experiences related to goal setting and challenges faced. Patient was able to clearly articulate an understanding of the SMART framework and proposed personal SMART goals related to their own ADLs with minimal assistance. They expressed enthusiasm about applying what they learned to their daily routine and appeared motivated to make changes.                  OT Education - 04/06/23 0837     Education Details OCAIRS / SMART Goals 2    Person(s) Educated Patient    Methods Handout;Explanation    Comprehension Verbalized understanding              OT Short Term Goals - 04/06/23 0840       OT SHORT TERM GOAL #1   Title Client will develop and utilize a personalized coping toolbox containing at least five  coping strategies to manage challenging situations, demonstrating their use in real-life scenarios by the end of therapy.    Time 4    Period Weeks    Status On-going    Target Date 05/06/23      OT SHORT TERM GOAL #2   Title By discharge, client will demonstrate the ability to adapt and modify routines when faced with unexpected events or changes, maintaining a balanced approach to daily tasks.    Status On-going      OT SHORT TERM GOAL #3   Title By the time of discharge, client will independently set, track, and make progress towards a long-term goal, demonstrating resilience in overcoming obstacles and seeking support when needed.    Status On-going  Plan - 04/06/23 0839     Clinical Impression Statement Pt presents w/ deficits in multiple psychosocial domains that inhibits occupational performance.    OT Occupational Profile and History Problem Focused Assessment - Including review of records relating to presenting problem    Occupational performance deficits (Please refer to evaluation for details): Rest and Sleep;Work;Leisure    Psychosocial Skills Interpersonal Interaction;Routines and Behaviors;Habits;Coping Strategies    Rehab Potential Good    Clinical Decision Making Limited treatment options, no task modification necessary    Comorbidities Affecting Occupational Performance: None    Modification or Assistance to Complete Evaluation  No modification of tasks or assist necessary to complete eval    OT Frequency 5x / week    OT Duration 4 weeks    OT Treatment/Interventions Coping strategies training;Psychosocial skills training    Consulted and Agree with Plan of Care Patient             Patient will benefit from skilled therapeutic intervention in order to improve the following deficits and impairments:       Psychosocial Skills: Interpersonal Interaction, Routines and Behaviors, Habits, Coping Strategies   Visit  Diagnosis: Difficulty coping  Unspecified mood (affective) disorder (HCC)  Cannabis use disorder  GAD (generalized anxiety disorder)  Severe episode of recurrent major depressive disorder, without psychotic features [facility])    Problem List Patient Active Problem List   Diagnosis Date Noted   Unspecified mood (affective) disorder (HCC) 04/04/2023   MDD (major depressive disorder), recurrent episode, severe (HCC) 03/30/2023   GAD (generalized anxiety disorder) 03/30/2023    Ted Mcalpine, OT 04/06/2023, 8:43 AM  Kerrin Champagne, OT   [facility] HOSPITALIZATION PROGRAM [address] [place], [place], [address] Phone: [phone]   Fax:  [phone]  Name: Alyssa Lutz MRN: [id number] Date of Birth: [birth year]

## 2023-04-07 ENCOUNTER — Other Ambulatory Visit (HOSPITAL_COMMUNITY): Payer: PRIVATE HEALTH INSURANCE

## 2023-04-08 ENCOUNTER — Other Ambulatory Visit (HOSPITAL_COMMUNITY): Payer: PRIVATE HEALTH INSURANCE

## 2023-04-08 ENCOUNTER — Other Ambulatory Visit (HOSPITAL_COMMUNITY): Payer: PRIVATE HEALTH INSURANCE | Admitting: Licensed Clinical Social Worker

## 2023-04-08 ENCOUNTER — Encounter (HOSPITAL_COMMUNITY): Payer: Self-pay | Admitting: Family

## 2023-04-08 DIAGNOSIS — F332 Major depressive disorder, recurrent severe without psychotic features: Secondary | ICD-10-CM

## 2023-04-08 DIAGNOSIS — R4589 Other symptoms and signs involving emotional state: Secondary | ICD-10-CM

## 2023-04-08 DIAGNOSIS — F39 Unspecified mood [affective] disorder: Secondary | ICD-10-CM

## 2023-04-08 NOTE — Progress Notes (Unsigned)
BH MD/PA/NP OP Progress Note  04/08/2023 11:44 AM Alyssa SHELLS  MRN:  [id number]  Chief Complaint:  " I do not think the medications is working I am having side effects."  HPI: Alyssa Lutz is a 42 year old female who is currently attending partial hospitalization programming.  She carries a diagnosis with bipolar 2 disorder,  major depressive disorder and generalized anxiety disorder.    Alyssa Lutz was seen and evaluated via video teleassessment.  She reports she recently had a medication adjustment and was advised to titrate Seroquel 50 mg to 75 mg nightly.  States the goal is to get her to Seroquel 100 mg.  She reports increased muscle aches and fatigue.  States she missed group session on yesterday due to reported symptoms.  Patient reports taking Seroquel 75 mg for the past 2 nights however, reports increased anxiety with current medication regimen.  States she had discussed multiple options related to medication adjustments.  States she is receptive to initiating Tegretol and/or Topamax to help with mood stabilization.  Discussed titrating Seroquel back down to 50 mg as she reports she has tolerated 50 mg in the past.  Patient was receptive to plan.   Medication adjustment: -Will consider initiating Tegretol and/or Topamax at follow-up appointment 8/20 -Continue Seroquel 50 mg nightly  During evaluation Alyssa Lutz is sitting; she is alert/oriented x 4; calm/cooperative; and mood congruent with affect.  Patient is speaking in a clear tone at moderate volume, and normal pace; with good eye contact. Her thought process is coherent and relevant; There is no indication that she is currently responding to internal/external stimuli or experiencing delusional thought content.  Patient denies suicidal/self-harm/homicidal ideation, psychosis, and paranoia.  Patient has remained calm throughout assessment and has answered questions appropriately.  Visit Diagnosis:    ICD-10-CM   1. Unspecified mood  (affective) disorder (HCC)  F39     2. Severe episode of recurrent major depressive disorder, without psychotic features (HCC)  F33.2       Past Psychiatric History: See chart  Past Medical History:  Past Medical History:  Diagnosis Date   ADHD    Anxiety    Depression    Graves disease    History of spinal fracture     Past Surgical History:  Procedure Laterality Date   APPENDECTOMY     CESAREAN SECTION      Family Psychiatric History: See chart  Family History: No family history on file.  Social History:  Social History   Socioeconomic History   Marital status: Divorced    Spouse name: Not on file   Number of children: Not on file   Years of education: Not on file   Highest education level: Not on file  Occupational History   Not on file  Tobacco Use   Smoking status: Never   Smokeless tobacco: Never  Vaping Use   Vaping status: Never Used  Substance and Sexual Activity   Alcohol use: Not Currently   Drug use: Never   Sexual activity: Yes  Other Topics Concern   Not on file  Social History Narrative   Not on file   Social Determinants of Health   Financial Resource Strain: Not on file  Food Insecurity: Not on file  Transportation Needs: Not on file  Physical Activity: Not on file  Stress: Not on file  Social Connections: Not on file    Allergies: No Known Allergies  Metabolic Disorder Labs: No results found for: "HGBA1C", "MPG" No results found  for: "PROLACTIN" No results found for: "CHOL", "TRIG", "HDL", "CHOLHDL", "VLDL", "LDLCALC" No results found for: "TSH"  Therapeutic Level Labs: No results found for: "LITHIUM" No results found for: "VALPROATE" No results found for: "CBMZ"  Current Medications: Current Outpatient Medications  Medication Sig Dispense Refill   BALZIVA 0.4-35 MG-MCG tablet Take 1 tablet by mouth daily.     benzonatate (TESSALON) 100 MG capsule Take 1 capsule (100 mg total) by mouth 3 (three) times daily as needed for  cough. Do not take with alcohol or while driving or operating heavy machinery. May cause drowsiness. 21 capsule 0   calcium citrate (CALCITRATE - DOSED IN MG ELEMENTAL CALCIUM) 950 (200 Ca) MG tablet Take by mouth.     Cholecalciferol (D 1000) 25 MCG (1000 UT) capsule Take by mouth.     diazepam (VALIUM) 2 MG tablet Take 1 tablet (2 mg total) by mouth daily as needed.     fluticasone (FLONASE) 50 MCG/ACT nasal spray Place 1 spray into both nostrils 2 (two) times daily. 16 g 2   methimazole (TAPAZOLE) 5 MG tablet Take 1 tablet (5 mg total) by mouth daily.     Multiple Vitamin (MULTIVITAMIN) capsule multivitamin     QUEtiapine (SEROQUEL) 50 MG tablet Take 2 tablets (100 mg total) by mouth at bedtime. 14 tablet 0   Vitamin D, Ergocalciferol, (DRISDOL) 1.25 MG (50000 UNIT) CAPS capsule Take by mouth.     No current facility-administered medications for this visit.     Musculoskeletal: Virtual visit  Psychiatric Specialty Exam: Review of Systems  There were no vitals taken for this visit.There is no height or weight on file to calculate BMI.  General Appearance: Casual  Eye Contact:  Good  Speech:  Clear and Coherent  Volume:  Normal  Mood:  Anxious  Affect:  Congruent  Thought Process:  Coherent  Orientation:  Full (Time, Place, and Person)  Thought Content: Logical   Suicidal Thoughts:  No  Homicidal Thoughts:  No  Memory:  Immediate;   Fair Recent;   Fair  Judgement:  Good  Insight:  Good  Psychomotor Activity:  Normal  Concentration:  Concentration: Good  Recall:  Good  Fund of Knowledge: Good  Language: Good  Akathisia:  No  Handed:  Right  AIMS (if indicated): done  Assets:  Communication Skills Desire for Improvement Resilience Social Support  ADL's:  Intact  Cognition: WNL  Sleep:  Good   Screenings: GAD-7    Advertising copywriter from 03/30/2023 in [facility] PARTIAL HOSPITALIZATION PROGRAM  Total GAD-7 Score 20      PHQ2-9    Flowsheet Row  Counselor from 03/30/2023 in [facility]AVIORAL [facility] PARTIAL HOSPITALIZATION PROGRAM  PHQ-2 Total Score 5  PHQ-9 Total Score 21      Flowsheet Row Counselor from 03/30/2023 in BEHAVIORAL [facility]ALTH PARTIAL HOSPITALIZATION PROGRAM ED from 03/29/2023 in [facility] ED from 10/11/2022 in [facility] [place]  C-SSRS RISK CATEGORY High Risk No Risk No Risk        Assessment and Plan:  Continue partial hospitalization programming Medication adjustment: -Will consider initiating Tegretol and/or Topamax at follow-up appointment 8/20 -Continue Seroquel 50 mg nightly   Collaboration of Care: Collaboration of Care: Medication Management AEB decrease Seroquel 75 mg to 50 mg nightly  Patient/Guardian was advised Release of Information must be obtained prior to any record release in order to collaborate their care with an outside provider. Patient/Guardian was advised if they have not already  done so to contact the registration department to sign all necessary forms in order for [place] to release information regarding their care.   Consent: Patient/Guardian gives verbal consent for treatment and assignment of benefits for services provided during this visit. Patient/Guardian expressed understanding and agreed to proceed.    [name], NP 04/08/2023, 11:44 AM

## 2023-04-11 ENCOUNTER — Other Ambulatory Visit (HOSPITAL_COMMUNITY): Payer: PRIVATE HEALTH INSURANCE

## 2023-04-11 ENCOUNTER — Other Ambulatory Visit (HOSPITAL_COMMUNITY): Payer: PRIVATE HEALTH INSURANCE | Admitting: Licensed Clinical Social Worker

## 2023-04-11 DIAGNOSIS — R4589 Other symptoms and signs involving emotional state: Secondary | ICD-10-CM

## 2023-04-11 DIAGNOSIS — F411 Generalized anxiety disorder: Secondary | ICD-10-CM

## 2023-04-11 DIAGNOSIS — F332 Major depressive disorder, recurrent severe without psychotic features: Secondary | ICD-10-CM

## 2023-04-11 DIAGNOSIS — F39 Unspecified mood [affective] disorder: Secondary | ICD-10-CM | POA: Diagnosis not present

## 2023-04-11 MED ORDER — TOPIRAMATE 25 MG PO TABS: 25.0000 mg | ORAL_TABLET | Freq: Every day | ORAL | 0 refills | Status: DC

## 2023-04-11 NOTE — Progress Notes (Signed)
BH MD/PA/NP OP Progress Note  Virtual Visit via Telephone Note  I connected with [name] on 04/11/23 at  9:00 AM EDT by a video enabled telemedicine application and verified that I am speaking with the correct person using two identifiers.  Location: Patient: Home Provider: Office   I discussed the limitations, risks, security and privacy concerns of performing an evaluation and management service by telephone and the availability of in person appointments. I also discussed with the patient that there may be a patient responsible charge related to this service. The patient expressed understanding and agreed to proceed.   I discussed the assessment and treatment plan with the patient. The patient was provided an opportunity to ask questions and all were answered. The patient agreed with the plan and demonstrated an understanding of the instructions.   The patient was advised to call back or seek an in-person evaluation if the symptoms worsen or if the condition fails to improve as anticipated.   Princess Bruins, DO Psych Resident, PGY-3   Name: Alyssa Lutz  MRN:  [id number]  Chief Complaint:  Chief Complaint  Patient presents with   Anxiety   HPI: Alyssa Lutz is a 42 y.o. female with PMH of MDD, possible Bipolar 2 d/o, suicide attempt, inpatient psych admission, Graves Disease (euthymic, on tapazole) who was admitted to [facility] for increased mood lability and chronic passive SI becoming more intrusive.   Mood: Better, but "Blah", anxious about medication trial of antipsychotics Got to 75 mg seroquel - reported "Tongue rolling and stiff muscle". Reported also felt "heavy". She talked to NP last week, and was back down to 25 mg seroquel.   Irritability was good, wasn't bad, did not get irritable.  Discussed mood stabilizers and she was amenable to trialling topamax, stated that she knows people that are on it and did well.    Sleep: good, 8ish hrs.   Appetite:  Intact  Safety: Denied active and passive SI (last time thursday, due to ), HI, AVH, paranoia.   Review of Systems  Respiratory:  Negative for shortness of breath.   Cardiovascular:  Negative for chest pain.  Gastrointestinal:  Negative for nausea and vomiting.  Neurological:  Negative for dizziness and headaches.     Past Psychiatric History:  Previous dx: MDD, possible Bipolar 2 d/o, ADHD dx in college by college clinic provider INPT: 1x in 2004 while in college due to SA, where she Od'd on klonopin after breakup with boyfriend. Did not require medical hospitalization due to throwing up pills. Was prescribed Prozac or Lexapro at that time.  Dc'd from hospital Seroquel, Trazodone, trileptal, and klonopin.  Previous meds: [Lexapro, Cymbalta, Prozac, Wellbutrin]- recalls during the 10 years she was on these meds she would often hear from others that she had too much energy, had odd cleaning patterns late at night, more suicidal, and excessive shopping), Adderall Lamictal (ok, but poor compliance), trazodone, Valium, and Seroquel (helpful), Abilfy (failed had cognitive impairment) Outpatient: Previously at [facility] (due to insurance) and previous therapy No self harm hx  Etoh: rare, maybe 1x/ year, makes her more depressed THC: hx of use throughout adult life, began using more frequently the last 5 years and the last 1 year. She last uesd about 3 weeks ago. She  is having a harder time for this first time w/o THC and is scared by this. Patient was using Delta products daily in vaping pen. Husband has since thrown away [facility].  Denies other illicit  subtances No hx of rehab  Family Psychiatric History:  Brother: Bipolar 1 d/o (hx of Lithium but had toxicity and is currently on Depakote and stable) Sister: Bipolar 2 on Abilify M uncle: suicide in [place] M aunt: institionalized whole life, (unsure why) in [place]  Additional Social History:  - Sister is a Engineering geologist -is a Estate manager/land agent  works for [employer] (not enjoying [employer]) - Married with 6 yo  - Support system: son, husband, close friends - family is in the [place] area - family from [place] and [place]  Visit Diagnosis:  No diagnosis found.   Past Medical History:  Past Medical History:  Diagnosis Date   ADHD    Anxiety    Depression    Graves disease    History of spinal fracture     Past Surgical History:  Procedure Laterality Date   APPENDECTOMY     CESAREAN SECTION      Family History:  No family history on file.  Social History:  Social History   Socioeconomic History   Marital status: Divorced    Spouse name: Not on file   Number of children: Not on file   Years of education: Not on file   Highest education level: Not on file  Occupational History   Not on file  Tobacco Use   Smoking status: Never   Smokeless tobacco: Never  Vaping Use   Vaping status: Never Used  Substance and Sexual Activity   Alcohol use: Not Currently   Drug use: Never   Sexual activity: Yes  Other Topics Concern   Not on file  Social History Narrative   Not on file   Social Determinants of Health   Financial Resource Strain: Not on file  Food Insecurity: Not on file  Transportation Needs: Not on file  Physical Activity: Not on file  Stress: Not on file  Social Connections: Not on file    Allergies:  No Known Allergies  Metabolic Disorder Labs: No results found for: "HGBA1C", "MPG" No results found for: "PROLACTIN" No results found for: "CHOL", "TRIG", "HDL", "CHOLHDL", "VLDL", "LDLCALC" No results found for: "TSH"  Therapeutic Level Labs: No results found for: "LITHIUM" No results found for: "VALPROATE" No results found for: "CBMZ"  Current Medications: Current Outpatient Medications  Medication Sig Dispense Refill   topiramate (TOPAMAX) 25 MG tablet Take 1 tablet (25 mg total) by mouth daily. 30 tablet 0   BALZIVA 0.4-35 MG-MCG tablet Take 1 tablet by mouth daily.      benzonatate (TESSALON) 100 MG capsule Take 1 capsule (100 mg total) by mouth 3 (three) times daily as needed for cough. Do not take with alcohol or while driving or operating heavy machinery. May cause drowsiness. 21 capsule 0   calcium citrate (CALCITRATE - DOSED IN MG ELEMENTAL CALCIUM) 950 (200 Ca) MG tablet Take by mouth.     Cholecalciferol (D 1000) 25 MCG (1000 UT) capsule Take by mouth.     diazepam (VALIUM) 2 MG tablet Take 1 tablet (2 mg total) by mouth daily as needed.     fluticasone (FLONASE) 50 MCG/ACT nasal spray Place 1 spray into both nostrils 2 (two) times daily. 16 g 2   methimazole (TAPAZOLE) 5 MG tablet Take 1 tablet (5 mg total) by mouth daily.     Multiple Vitamin (MULTIVITAMIN) capsule multivitamin     QUEtiapine (SEROQUEL) 50 MG tablet Take 2 tablets (100 mg total) by mouth at bedtime. 14 tablet 0   Vitamin D,  Ergocalciferol, (DRISDOL) 1.25 MG (50000 UNIT) CAPS capsule Take by mouth.     No current facility-administered medications for this visit.   Psychiatric Specialty Exam: There were no vitals taken for this visit.  There is no height or weight on file to calculate BMI.  General Appearance: Casual, faily groomed  Eye Contact:  Good    Speech:  Clear, coherent, normal rate, hyperverbal  Volume:  Normal   Mood:  "Worried"  Affect:  Appropriate, congruent, full range  Thought Content: Logical, rumination  Suicidal Thoughts: Denied active and passive SI    Thought Process:  Coherent, goal-directed, linear   Orientation:  A&Ox4   Memory:  Immediate good  Judgment:  Fair   Insight:  Fair   Concentration:  Attention and concentration good   Recall:  Good  Fund of Knowledge: Good  Language: Good, fluent  Psychomotor Activity: Grossly normal, limited by virtual encounter, possibly increased due to fidgeting from anxiety  Akathisia:  NA   AIMS (if indicated): NA   Assets:  Communication Skills Desire for Improvement Financial  Resources/Insurance Housing Intimacy Leisure Time Physical Health Resilience Social Support Talents/Skills Transportation Vocational/Educational  ADL's:  Intact  Cognition: WNL  Sleep:  Fair     Screenings: GAD-7    Advertising copywriter from 03/30/2023 in BEHAVIORAL HEALTH PARTIAL HOSPITALIZATION PROGRAM  Total GAD-7 Score 20      PHQ2-9    Flowsheet Row Counselor from 03/30/2023 in BEHAVIORAL HEALTH PARTIAL HOSPITALIZATION PROGRAM  PHQ-2 Total Score 5  PHQ-9 Total Score 21      Flowsheet Row Counselor from 03/30/2023 in BEHAVIORAL HEALTH PARTIAL HOSPITALIZATION PROGRAM ED from [facility]/29/2023 in [facility] ED from 10/11/2022 in [facility] [place]  C-SSRS RISK CATEGORY High Risk No Risk No Risk       Assessment and Plan:   Unspecified mood disorder (rule out bipolar 2 disorder)  GAD Main symptoms of concern are irritability, mood lability, and acute change in chronic passive SI to now more intrusive. Unclear etiology, suspect this is due to increased cannabis use. Other wondering if this could be severe anxiety causing irritability rather than bipolar d/o, but patient does have strong family history of bipolar spectrum disorder. For this reason will treat with antipsychotic and mood stabilizer.  Seroquel 50 mg at bedtime was helpful with anxiety and sleep, however she reported multiple somatic sxs, suspect somatization due to severe anxiety rather than actual TD or dystonia since seroquel is one of the least likely to cause these sxs and she is also at a very low dose.  INCREASED Seroquel 25 mg to 50 mg at bedtime STARTED topamax 25 mg daily  Cannabis use Last time used was about 3 weeks ago. Encouraged cessation  Graves' disease Followed by endocrinologist in [facility] Continued home Tapazole Ambulatory PCP referral-[facility] Labs: CBC, CMP, TSH, free T4, free T3, lipid panel, A1c  Moderate VitD insufficiency Repletion per  endocrinologist  Collaboration of Care: Patient is to continue therapy and follow-up with their outpatient provider  Patient/Guardian was advised Release of Information must be obtained prior to any record release in order to collaborate their care with an outside provider. Patient/Guardian was advised if they have not already done so to contact the registration department to sign all necessary forms in order for [place] to release information regarding their care.   Consent: Patient/Guardian gives verbal consent for treatment and assignment of benefits for services provided during this visit. Patient/Guardian expressed understanding and agreed  to proceed.   Princess Bruins, DO Psych Resident, PGY-3 04/11/2023, 9:00 PM

## 2023-04-12 ENCOUNTER — Encounter (HOSPITAL_COMMUNITY): Payer: Self-pay

## 2023-04-12 ENCOUNTER — Other Ambulatory Visit (HOSPITAL_COMMUNITY): Payer: PRIVATE HEALTH INSURANCE

## 2023-04-12 ENCOUNTER — Other Ambulatory Visit (HOSPITAL_COMMUNITY): Payer: PRIVATE HEALTH INSURANCE | Admitting: Licensed Clinical Social Worker

## 2023-04-12 DIAGNOSIS — F332 Major depressive disorder, recurrent severe without psychotic features: Secondary | ICD-10-CM

## 2023-04-12 DIAGNOSIS — R4589 Other symptoms and signs involving emotional state: Secondary | ICD-10-CM

## 2023-04-12 DIAGNOSIS — F39 Unspecified mood [affective] disorder: Secondary | ICD-10-CM | POA: Diagnosis not present

## 2023-04-12 DIAGNOSIS — F411 Generalized anxiety disorder: Secondary | ICD-10-CM

## 2023-04-12 NOTE — Therapy (Signed)
[facility] PARTIAL HOSPITALIZATION PROGRAM [address] [place], [place], [address] Phone: [phone]   Fax:  [phone]  Occupational Therapy Treatment Virtual Visit via Video Note  I connected with [name] on 04/12/23 at  8:00 AM EDT by a video enabled telemedicine application and verified that I am speaking with the correct person using two identifiers.  Location: Patient: home Provider: office   I discussed the limitations of evaluation and management by telemedicine and the availability of in person appointments. The patient expressed understanding and agreed to proceed.    The patient was advised to call back or seek an in-person evaluation if the symptoms worsen or if the condition fails to improve as anticipated.  I provided 55 minutes of non-face-to-face time during this encounter.   Patient Details  Name: Alyssa Lutz MRN: [id number] Date of Birth: 03/22/1981 No data recorded  Encounter Date: 04/11/2023   OT End of Session - 04/12/23 0940     Visit Number 4    Number of Visits 20    Date for OT Re-Evaluation 05/06/23    OT Start Time 1200    OT Stop Time 1255    OT Time Calculation (min) 55 min             Past Medical History:  Diagnosis Date   ADHD    Anxiety    Depression    Graves disease    History of spinal fracture     Past Surgical History:  Procedure Laterality Date   APPENDECTOMY     CESAREAN SECTION      There were no vitals filed for this visit.   Subjective Assessment - 04/12/23 0939     Currently in Pain? No/denies    Pain Score 0-No pain                 Group Session:  S: Better today  O: In this communication group therapy session, facilitated by an occupational therapist, participants explored several key subtopics aimed at enhancing their interpersonal skills. The session began with a discussion on the use of "I" and "AND" statements, emphasizing personal responsibility and  constructive language in expressing feelings and needs. Participants then practiced active listening techniques to improve their ability to fully understand and respond to others. The group also delved into assertive communication, learning how to express themselves confidently and respectfully. Emotional regulation skills were addressed, providing strategies for managing emotions during interactions. Social skills training included role-playing scenarios to build confidence in various social contexts. Feedback and reflection were integral parts of the session, with participants offering and receiving constructive feedback to foster personal growth. The group identified common barriers to effective communication, such as anxiety, fear of judgment, and past negative experiences, and discussed strategies to overcome these challenges, including mindfulness practices and building a supportive network.   A: The patient actively participated in the group therapy session, demonstrating a high level of engagement and enthusiasm. They contributed to discussions on "I" and "AND" statements by sharing personal examples and insights. During the active listening exercises, the patient was attentive and provided thoughtful feedback to peers. They effectively practiced assertive communication techniques and showed a clear understanding of emotional regulation strategies. In social skills role-playing, the patient was confident and responsive, indicating a good grasp of the concepts. The patient also engaged in the feedback and reflection segment, offering constructive comments and showing receptivity to feedback from others. Their proactive approach and willingness to explore  personal barriers to communication suggest significant progress and motivation to improve interpersonal skills.    P: Continue to attend PHP OT group sessions 5x week for 4 weeks to promote daily structure, social engagement, and opportunities to  develop and utilize adaptive strategies to maximize functional performance in preparation for safe transition and integration back into school, work, and the community. Plan to address topic of pt 2 in next OT group session.                  OT Education - 04/12/23 0939     Education Details Communication 1              OT Short Term Goals - 04/06/23 0840       OT SHORT TERM GOAL #1   Title Client will develop and utilize a personalized coping toolbox containing at least five coping strategies to manage challenging situations, demonstrating their use in real-life scenarios by the end of therapy.    Time 4    Period Weeks    Status On-going    Target Date 05/06/23      OT SHORT TERM GOAL #2   Title By discharge, client will demonstrate the ability to adapt and modify routines when faced with unexpected events or changes, maintaining a balanced approach to daily tasks.    Status On-going      OT SHORT TERM GOAL #3   Title By the time of discharge, client will independently set, track, and make progress towards a long-term goal, demonstrating resilience in overcoming obstacles and seeking support when needed.    Status On-going                      Plan - 04/12/23 0940     Psychosocial Skills Interpersonal Interaction;Routines and Behaviors;Habits;Coping Strategies             Patient will benefit from skilled therapeutic intervention in order to improve the following deficits and impairments:       Psychosocial Skills: Interpersonal Interaction, Routines and Behaviors, Habits, Coping Strategies   Visit Diagnosis: Difficulty coping    Problem List Patient Active Problem List   Diagnosis Date Noted   Graves disease 04/06/2023   Unspecified mood (affective) disorder (HCC) 04/04/2023   MDD (major depressive disorder), recurrent episode, severe (HCC) 03/30/2023   GAD (generalized anxiety disorder) 03/30/2023    Ted Mcalpine,  OT 04/12/2023, 9:40 AM  Kerrin Champagne, OT   [facility] HOSPITALIZATION PROGRAM [address] [place], [place], [address] Phone: [phone]   Fax:  [phone]  Name: BERANIA BLUMENSHINE MRN: [id number] Date of Birth: [birth year]

## 2023-04-12 NOTE — Therapy (Signed)
[facility] PARTIAL HOSPITALIZATION PROGRAM [address] [place], [place], [address] Phone: [phone]   Fax:  [phone]  Occupational Therapy Treatment Virtual Visit via Video Note  I connected with [name] on 04/12/23 at  8:00 AM EDT by a video enabled telemedicine application and verified that I am speaking with the correct person using two identifiers.  Location: Patient: home Provider: office   I discussed the limitations of evaluation and management by telemedicine and the availability of in person appointments. The patient expressed understanding and agreed to proceed.    The patient was advised to call back or seek an in-person evaluation if the symptoms worsen or if the condition fails to improve as anticipated.  I provided 55 minutes of non-face-to-face time during this encounter.   Patient Details  Name: Alyssa Lutz MRN: [id number] Date of Birth: 10-23-1980 No data recorded  Encounter Date: 04/08/2023   OT End of Session - 04/12/23 0924     Visit Number 3    Number of Visits 20    Date for OT Re-Evaluation 05/06/23    OT Start Time 1200    OT Stop Time 1255    OT Time Calculation (min) 55 min             Past Medical History:  Diagnosis Date   ADHD    Anxiety    Depression    Graves disease    History of spinal fracture     Past Surgical History:  Procedure Laterality Date   APPENDECTOMY     CESAREAN SECTION      There were no vitals filed for this visit.   Subjective Assessment - 04/12/23 0923     Currently in Pain? No/denies    Pain Score 0-No pain                 Group Session:  S: Doing better today I believe.   O: The primary objective of this topic is to explore and understand the concept of occupational balance in the context of daily living. The term "occupational balance" is defined broadly, encompassing all activities that occupy an individual's time and energy, including  self-care, leisure, and work-related tasks. The goal is to guide participants towards achieving a harmonious blend of these activities, tailored to their personal values and life circumstances. This balance is aimed at enhancing overall well-being, not by equally distributing time across activities, but by ensuring that daily engagements are fulfilling and not draining. The content delves into identifying various barriers that individuals face in achieving occupational balance, such as overcommitment, misaligned priorities, external pressures, and lack of effective time management. The impact of these barriers on occupational performance, roles, and lifestyles is examined, highlighting issues like reduced efficiency, strained relationships, and potential health problems. Strategies for cultivating occupational balance are a key focus. These strategies include practical methods like time blocking, prioritizing tasks, establishing self-care rituals, decluttering, connecting with nature, and engaging in reflective practices. These approaches are designed to be adaptable and applicable to a wide range of life scenarios, promoting a proactive and mindful approach to daily living. The overall aim is to equip participants with the knowledge and tools to create a balanced lifestyle that supports their mental, emotional, and physical health, thereby improving their functional performance in daily life.   A:  The patient demonstrated a high level of engagement and active participation throughout the session on occupational balance. The patient frequently contributed to discussions, offering insightful  reflections on personal experiences related to the barriers and strategies for achieving occupational balance. There was a clear understanding of the concept and an ability to relate it to their own life. The patient showed enthusiasm in learning and applying the strategies discussed, such as time blocking and self-care  rituals, indicating a strong motivation to improve their occupational balance. The patient's proactive approach and responsiveness to the topic suggest a high potential for implementing these strategies effectively in their daily routine.    P: Continue to attend PHP OT group sessions 5x week for 4 weeks to promote daily structure, social engagement, and opportunities to develop and utilize adaptive strategies to maximize functional performance in preparation for safe transition and integration back into school, work, and the community. Plan to address topic of communication in next OT group session.                  OT Education - 04/12/23 0923     Education Details Occupational Balance 3              OT Short Term Goals - 04/06/23 0840       OT SHORT TERM GOAL #1   Title Client will develop and utilize a personalized coping toolbox containing at least five coping strategies to manage challenging situations, demonstrating their use in real-life scenarios by the end of therapy.    Time 4    Period Weeks    Status On-going    Target Date 05/06/23      OT SHORT TERM GOAL #2   Title By discharge, client will demonstrate the ability to adapt and modify routines when faced with unexpected events or changes, maintaining a balanced approach to daily tasks.    Status On-going      OT SHORT TERM GOAL #3   Title By the time of discharge, client will independently set, track, and make progress towards a long-term goal, demonstrating resilience in overcoming obstacles and seeking support when needed.    Status On-going                      Plan - 04/12/23 0924     Psychosocial Skills Interpersonal Interaction;Routines and Behaviors;Habits;Coping Strategies             Patient will benefit from skilled therapeutic intervention in order to improve the following deficits and impairments:       Psychosocial Skills: Interpersonal Interaction, Routines and  Behaviors, Habits, Coping Strategies   Visit Diagnosis: Difficulty coping    Problem List Patient Active Problem List   Diagnosis Date Noted   Graves disease 04/06/2023   Unspecified mood (affective) disorder (HCC) 04/04/2023   MDD (major depressive disorder), recurrent episode, severe (HCC) 03/30/2023   GAD (generalized anxiety disorder) 03/30/2023    Ted Mcalpine, OT 04/12/2023, 9:24 AM  Kerrin Champagne, OT   [facility] HOSPITALIZATION PROGRAM [address] [place], [place], [address] Phone: [phone]   Fax:  [phone]  Name: Alyssa Lutz MRN: [id number] Date of Birth: 1980-10-11

## 2023-04-13 ENCOUNTER — Other Ambulatory Visit (HOSPITAL_COMMUNITY): Payer: PRIVATE HEALTH INSURANCE | Admitting: Licensed Clinical Social Worker

## 2023-04-13 ENCOUNTER — Other Ambulatory Visit (HOSPITAL_COMMUNITY): Payer: PRIVATE HEALTH INSURANCE

## 2023-04-13 ENCOUNTER — Encounter (HOSPITAL_COMMUNITY): Payer: Self-pay

## 2023-04-13 DIAGNOSIS — F39 Unspecified mood [affective] disorder: Secondary | ICD-10-CM | POA: Diagnosis not present

## 2023-04-13 DIAGNOSIS — F411 Generalized anxiety disorder: Secondary | ICD-10-CM

## 2023-04-13 DIAGNOSIS — R4589 Other symptoms and signs involving emotional state: Secondary | ICD-10-CM

## 2023-04-13 DIAGNOSIS — F332 Major depressive disorder, recurrent severe without psychotic features: Secondary | ICD-10-CM

## 2023-04-13 NOTE — Psych (Signed)
Virtual Visit via Video Note  I connected with Alyssa Lutz on 04/04/23 at  9:00 AM EDT by a video enabled telemedicine application and verified that I am speaking with the correct person using two identifiers.  Location: Patient: patient home Provider: clinical home office   I discussed the limitations of evaluation and management by telemedicine and the availability of in person appointments. The patient expressed understanding and agreed to proceed.  I discussed the assessment and treatment plan with the patient. The patient was provided an opportunity to ask questions and all were answered. The patient agreed with the plan and demonstrated an understanding of the instructions.   The patient was advised to call back or seek an in-person evaluation if the symptoms worsen or if the condition fails to improve as anticipated.  Pt was provided 240 minutes of non-face-to-face time during this encounter.   Donia Guiles, LCSW   [facility] BH PHP THERAPIST PROGRESS NOTE  Alyssa Lutz [id number]  Session Time: 9:00 - 10:00  Participation Level: Active  Behavioral Response: CasualAlertDepressed  Type of Therapy: Group Therapy  Treatment Goals addressed: Coping  Progress Towards Goals: Initial  Interventions: CBT, DBT, Supportive, and Reframing  Summary: Alyssa Lutz is a 42 y.o. female who presents with anxiety and mood symptoms. Clinician led check-in regarding current stressors and situation, and review of patient completed daily inventory. Clinician utilized active listening and empathetic response and validated patient emotions. Clinician facilitated processing group on pertinent issues.?    Therapist Response:  Patient arrived within time allowed. Patient rates her mood at a 2 on a scale of 1-10 with 10 being best. Pt states she feels "not too good." Pt states she slept 4 hours and ate 2x. Pt reports opening up is difficult for her and she is anxious being in this new setting. Pt  states the weekend was "pretty rough." Pt reports some passive SI and denies plan and intent. Patient able to process. Patient minimally engaged in discussion.            Session Time: 10:00 am - 11:00 am   Participation Level: Active   Behavioral Response: CasualAlertDepressed   Type of Therapy: Group Therapy   Treatment Goals addressed: Coping   Progress Towards Goals: Progressing   Interventions: CBT, DBT, Solution Focused, Strength-based, Supportive, and Reframing   Therapist Response:  [name] led processing group for pt's current struggles. Group members shared stressors and provided support and feedback. [name] brought in topics of boundaries, healthy relationships, and unhealthy thought processes to inform discussion.    Therapist Response: Pt able to process and provide support to group.            Session Time: 11:00 -12:00   Participation Level: Active   Behavioral Response: CasualAlertDepressed   Type of Therapy: Group Therapy   Treatment Goals addressed: Coping   Progress Towards Goals: Progressing   Interventions: CBT, DBT, Solution Focused, Strength-based, Supportive, and Reframing   Summary: [name] introduced CBT and the way in which it can provide context for addressing stumbling blocks. Group discussed "the problem is not the problem, the problem is how we're thinking about the problem" and tried to change perspective on current struggles.     Therapist Response: Pt engaged in discussion and is able to attempt reframing using CBT.            Session Time: 12:00 -1:00   Participation Level: Active   Behavioral Response: CasualAlertDepressed   Type of Therapy: Group therapy  Treatment Goals addressed: Coping   Progress Towards Goals: Progressing   Interventions: OT group   Summary: 12:00 - 12:50: Occupational Therapy group with cln E. Hollan.  12:50 - 1:00 Clinician assessed for immediate needs, medication compliance and efficacy, and safety  concerns.   Therapist Response: 12:00 - 12:50: See note 12:50 - 1:00 pm: At check-out, patient reports no immediate concerns. Patient demonstrates progress as evidenced by participating in first group session. Patient denies SI/HI/self-harm thoughts at the end of group.  Suicidal/Homicidal: Nowithout intent/plan  Plan: Pt will continue in PHP while working to decrease mood and anxiety symptoms, increase daily functioning, and increase ability to manage symptoms in a healthy manner.   Collaboration of Care: Medication Management AEB J Cyndie Chime  Patient/Guardian was advised Release of Information must be obtained prior to any record release in order to collaborate their care with an outside provider. Patient/Guardian was advised if they have not already done so to contact the registration department to sign all necessary forms in order for [place] to release information regarding their care.   Consent: Patient/Guardian gives verbal consent for treatment and assignment of benefits for services provided during this visit. Patient/Guardian expressed understanding and agreed to proceed.   Diagnosis: GAD (generalized anxiety disorder) [F41.1]    1. GAD (generalized anxiety disorder)   2. Unspecified mood (affective) disorder (HCC)   3. Cannabis use disorder      Donia Guiles, LCSW

## 2023-04-13 NOTE — Psych (Signed)
Virtual Visit via Video Note  I connected with Alyssa Lutz on 04/06/23 at  9:00 AM EDT by a video enabled telemedicine application and verified that I am speaking with the correct person using two identifiers.  Location: Patient: patient home Provider: clinical home office   I discussed the limitations of evaluation and management by telemedicine and the availability of in person appointments. The patient expressed understanding and agreed to proceed.  I discussed the assessment and treatment plan with the patient. The patient was provided an opportunity to ask questions and all were answered. The patient agreed with the plan and demonstrated an understanding of the instructions.   The patient was advised to call back or seek an in-person evaluation if the symptoms worsen or if the condition fails to improve as anticipated.  Pt was provided 240 minutes of non-face-to-face time during this encounter.   Donia Guiles, LCSW   [facility] BH PHP THERAPIST PROGRESS NOTE  Alyssa Lutz [id number]  Session Time: 9:00 - 10:00  Participation Level: Active  Behavioral Response: CasualAlertDepressed  Type of Therapy: Group Therapy  Treatment Goals addressed: Coping  Progress Towards Goals: Initial  Interventions: CBT, DBT, Supportive, and Reframing  Summary: Alyssa Lutz is a 42 y.o. female who presents with anxiety and mood symptoms. Clinician led check-in regarding current stressors and situation, and review of patient completed daily inventory. Clinician utilized active listening and empathetic response and validated patient emotions. Clinician facilitated processing group on pertinent issues.?    Therapist Response:  Patient arrived within time allowed. Patient rates her mood at a 5 on a scale of 1-10 with 10 being best. Pt states she feels "okay right now." Pt states she slept 8.5 hours and ate 3x+. Pt reports she went on a 2 hour rabbit hole reading message boards about side  effects/pt experiences on the medications she is on. Pt reports spiraling and struggling to manage anxiety about the medication. Pt states having vivid dreams again last night and struggling with drowsiness. Pt states taking a 4 hr nap after group and also being unable to drive son to camp today due to drowsiness. Patient able to process. Patient minimally engaged in discussion.            Session Time: 10:00 am - 11:00 am   Participation Level: Active   Behavioral Response: CasualAlertDepressed   Type of Therapy: Group Therapy   Treatment Goals addressed: Coping   Progress Towards Goals: Progressing   Interventions: CBT, DBT, Solution Focused, Strength-based, Supportive, and Reframing   Therapist Response: [name] led processing group for pt's current struggles. Group members shared stressors and provided support and feedback. [name] brought in topics of boundaries, healthy relationships, and unhealthy thought processes to inform discussion.    Therapist Response:  Pt able to process and provide support to group.           Session Time: 11:00 -12:00   Participation Level: Active   Behavioral Response: CasualAlertDepressed   Type of Therapy: Group Therapy   Treatment Goals addressed: Coping   Progress Towards Goals: Progressing   Interventions: Strength-based, Supportive, and Reframing   Summary: [name] group with [name]   Therapist Response: Pt participated and engaged in discussion.         Session Time: 12:00 -1:00   Participation Level: Active   Behavioral Response: CasualAlertDepressed   Type of Therapy: Group therapy   Treatment Goals addressed: Coping   Progress Towards Goals: Progressing   Interventions: CBT, DBT, Solution  Focused, Strength-based, Supportive, and Reframing   Summary: 12:00 - 12:50: [name] led discussion on emotional reasoning and provided context in terms of CBT unhealthy thought patterns. [name] encouraged pt's to be more specific in  their speech and thought dialogues to highlight the presence of a feeling, a mutable and time limited experience. Group members were encouraged to use reminder: "feelings do not equal fact." 12:50 - 1:00 Clinician assessed for immediate needs, medication compliance and efficacy, and safety concerns.   Therapist Response: 12:00 - 12:50: Pt engaged in discussion and is able to identify patterns in which they struggle to differentiate feelings and fact.  12:50 - 1:00 pm: At check-out, patient reports no immediate concerns. Patient demonstrates progress as evidenced by continued engagement and responsiveness to treatment. Patient denies SI/HI/self-harm thoughts at the end of group.  Suicidal/Homicidal: Nowithout intent/plan  Plan: Pt will continue in PHP while working to decrease mood and anxiety symptoms, increase daily functioning, and increase ability to manage symptoms in a healthy manner.   Collaboration of Care: Medication Management AEB J Cyndie Chime  Patient/Guardian was advised Release of Information must be obtained prior to any record release in order to collaborate their care with an outside provider. Patient/Guardian was advised if they have not already done so to contact the registration department to sign all necessary forms in order for [place] to release information regarding their care.   Consent: Patient/Guardian gives verbal consent for treatment and assignment of benefits for services provided during this visit. Patient/Guardian expressed understanding and agreed to proceed.   Diagnosis: Severe episode of recurrent major depressive disorder, without psychotic features (HCC) [F33.2]    1. Severe episode of recurrent major depressive disorder, without psychotic features (HCC)   2. Unspecified mood (affective) disorder (HCC)   3. GAD (generalized anxiety disorder)   4. Graves disease      Donia Guiles, LCSW

## 2023-04-13 NOTE — Psych (Signed)
Virtual Visit via Video Note  I connected with Alyssa Lutz on 04/05/23 at  9:00 AM EDT by a video enabled telemedicine application and verified that I am speaking with the correct person using two identifiers.  Location: Patient: patient home Provider: clinical home office   I discussed the limitations of evaluation and management by telemedicine and the availability of in person appointments. The patient expressed understanding and agreed to proceed.  I discussed the assessment and treatment plan with the patient. The patient was provided an opportunity to ask questions and all were answered. The patient agreed with the plan and demonstrated an understanding of the instructions.   The patient was advised to call back or seek an in-person evaluation if the symptoms worsen or if the condition fails to improve as anticipated.  Pt was provided 240 minutes of non-face-to-face time during this encounter.   Donia Guiles, LCSW   [facility] BH PHP THERAPIST PROGRESS NOTE  Alyssa Lutz [id number]  Session Time: 9:00 - 10:00  Participation Level: Active  Behavioral Response: CasualAlertDepressed  Type of Therapy: Group Therapy  Treatment Goals addressed: Coping  Progress Towards Goals: Initial  Interventions: CBT, DBT, Supportive, and Reframing  Summary: Alyssa Lutz is a 42 y.o. female who presents with anxiety and mood symptoms. Clinician led check-in regarding current stressors and situation, and review of patient completed daily inventory. Clinician utilized active listening and empathetic response and validated patient emotions. Clinician facilitated processing group on pertinent issues.?    Therapist Response:  Patient arrived within time allowed. Patient rates her mood at a 2 on a scale of 1-10 with 10 being best. Pt states she feels "blah." Pt states she slept 6 hours and ate 3x+. Pt reports spending her afternoon working to organize her son's closet for back-to-school. Pt  reports high anxiety re: medications and acknowledges that she has difficulty with side effects, catastrophic thinking, and stopping medications abruptly. Pt reports she is trying to not do that this time and is struggling with managing the anxiety. Pt reports feeling drowsy and having vivid dreams on her current medicine. Pt states struggle with anger. Patient able to process. Patient minimally engaged in discussion.            Session Time: 10:00 am - 11:00 am   Participation Level: Active   Behavioral Response: CasualAlertDepressed   Type of Therapy: Group Therapy   Treatment Goals addressed: Coping   Progress Towards Goals: Progressing   Interventions: CBT, DBT, Solution Focused, Strength-based, Supportive, and Reframing   Therapist Response:  [name] led discussion on "work arounds" or finding intermediate solutions while we are in the process of working on a final solution. Group members shared situations in which they are struggling with an issue that they are unable to fix quickly. [name] encouraged pt's to consider ways to make things "doable" and not let "perfect" stand in the way.    Therapist Response: Pt engaged in discussion and is able to connect with topic.          Session Time: 11:00 -12:00   Participation Level: Active   Behavioral Response: CasualAlertDepressed   Type of Therapy: Group Therapy   Treatment Goals addressed: Coping   Progress Towards Goals: Progressing   Interventions: CBT, DBT, Solution Focused, Strength-based, Supportive, and Reframing   Summary: [name] introduced topic of CBT cognitive distortions. [name] discussed unhealthy thought patterns and how our thoughts shape our reality and irrational thoughts can alter our perspective.    Therapist Response:  Pt engaged in discussion and is able to determine examples of distorted thinking in their own life.            Session Time: 12:00 -1:00   Participation Level: Active   Behavioral Response:  CasualAlertDepressed   Type of Therapy: Group therapy   Treatment Goals addressed: Coping   Progress Towards Goals: Progressing   Interventions: OT group   Summary: 12:00 - 12:50: Occupational Therapy group with cln E. Hollan.  12:50 - 1:00 Clinician assessed for immediate needs, medication compliance and efficacy, and safety concerns.   Therapist Response: 12:00 - 12:50: See note 12:50 - 1:00 pm: At check-out, patient reports no immediate concerns. Patient demonstrates progress as evidenced by continued engagement and responsiveness to treatment. Patient denies SI/HI/self-harm thoughts at the end of group.  Suicidal/Homicidal: Nowithout intent/plan  Plan: Pt will continue in PHP while working to decrease mood and anxiety symptoms, increase daily functioning, and increase ability to manage symptoms in a healthy manner.   Collaboration of Care: Medication Management AEB J Cyndie Chime  Patient/Guardian was advised Release of Information must be obtained prior to any record release in order to collaborate their care with an outside provider. Patient/Guardian was advised if they have not already done so to contact the registration department to sign all necessary forms in order for [place] to release information regarding their care.   Consent: Patient/Guardian gives verbal consent for treatment and assignment of benefits for services provided during this visit. Patient/Guardian expressed understanding and agreed to proceed.   Diagnosis: Severe episode of recurrent major depressive disorder, without psychotic features (HCC) [F33.2]    1. Severe episode of recurrent major depressive disorder, without psychotic features (HCC)   2. Unspecified mood (affective) disorder (HCC)   3. GAD (generalized anxiety disorder)      Donia Guiles, LCSW

## 2023-04-13 NOTE — Progress Notes (Signed)
Spoke with patient via Teams video call, used 2 identifiers to correctly identify patient. States this is her first time in PHP. She went to urgent care to be seen and was recommended for PHP. She stopped some of her medications 2 months ago on her own because she felt they were causing issues. She is having work stress. Wondering if she has Bipolar disorder because her siblings do and she has had a lot of mood instability. Groups are going well for her so far. Denies SI/HI or AV hallucinations.On scale 1-10 as 10 being worst she rates depression at 5 and anxiety at 7. PHQ9=17. No side effects from medication but she states she could be and doesn't really know. She does have trouble concentration some days. Feels "out of it." Pleasant, cooperative. Appropriate affect.

## 2023-04-13 NOTE — Psych (Signed)
Virtual Visit via Video Note  I connected with Alyssa Lutz on 04/08/23 at  9:00 AM EDT by a video enabled telemedicine application and verified that I am speaking with the correct person using two identifiers.  Location: Patient: patient home Provider: clinical home office   I discussed the limitations of evaluation and management by telemedicine and the availability of in person appointments. The patient expressed understanding and agreed to proceed.  I discussed the assessment and treatment plan with the patient. The patient was provided an opportunity to ask questions and all were answered. The patient agreed with the plan and demonstrated an understanding of the instructions.   The patient was advised to call back or seek an in-person evaluation if the symptoms worsen or if the condition fails to improve as anticipated.  Pt was provided 240 minutes of non-face-to-face time during this encounter.   Donia Guiles, LCSW   [facility] PHP THERAPIST PROGRESS NOTE  Alyssa Lutz [id number]  Session Time: 9:00 - 10:00  Participation Level: Active  Behavioral Response: CasualAlertDepressed  Type of Therapy: Group Therapy  Treatment Goals addressed: Coping  Progress Towards Goals: Progressing  Interventions: CBT, DBT, Supportive, and Reframing  Summary: Alyssa Lutz is a 42 y.o. female who presents with anxiety and mood symptoms. Clinician led check-in regarding current stressors and situation, and review of patient completed daily inventory. Clinician utilized active listening and empathetic response and validated patient emotions. Clinician facilitated processing group on pertinent issues.?    Therapist Response:  Patient arrived within time allowed. Patient rates her mood at a 5 on a scale of 1-10 with 10 being best. Pt states she feels "more alive." Pt states she slept 9.5 hours and ate 3x+. Pt reports she slept most of the day yesterday which she attributes to the increased  dose of seroquel. Pt states she slept 9am - 3p and then ate and continued to alternate between those two until she went to bed for the night. Pt reports she too a lowered dose today and that the over-sleeping spiked her medication anxiety. Patient able to process. Patient minimally engaged in discussion.            Session Time: 10:00 am - 11:00 am   Participation Level: Active   Behavioral Response: CasualAlertDepressed   Type of Therapy: Group Therapy   Treatment Goals addressed: Coping   Progress Towards Goals: Progressing   Interventions: CBT, DBT, Solution Focused, Strength-based, Supportive, and Reframing   Therapist Response: [name] led discussion on ways to manage stressors and feelings over the weekend. Group members  brainstormed things to do over the weekend for multiple levels of energy, access, and moods. [name] reviewed crisis services should they be needed and provided pt's with the text crisis line, mobile crisis, national suicide hotline, [facility] 24/7 line, and information on [facility].      Therapist Response: Pt engaged in discussion and is able to identify 3 ideas of what to do over the weekend to keep their mind engaged.           Session Time: 11:00 -12:00   Participation Level: Active   Behavioral Response: CasualAlertDepressed   Type of Therapy: Group Therapy   Treatment Goals addressed: Coping   Progress Towards Goals: Progressing   Interventions: CBT, DBT, Solution Focused, Strength-based, Supportive, and Reframing   Summary: [name] continued topic of CBT cognitive distortions and introduced thought challenging as a way to  utilize the "challenge" C in C-C-C. Group utilized  handout "Socratic questions" as a way to introduce challenges and reframe distorted thinking. Group members worked through pt examples to practice challenging distorted thinking.    Therapist Response: Pt engaged in discussion and demonstrates understanding of challenging distorted  thoughts through practice.            Session Time: 12:00 -1:00   Participation Level: Active   Behavioral Response: CasualAlertDepressed   Type of Therapy: Group therapy   Treatment Goals addressed: Coping   Progress Towards Goals: Progressing   Interventions: OT group   Summary: 12:00 - 12:50: Occupational Therapy group with cln E. Hollan.  12:50 - 1:00 Clinician assessed for immediate needs, medication compliance and efficacy, and safety concerns.   Therapist Response: 12:00 - 12:50: See note 12:50 - 1:00 pm: At check-out, patient reports no immediate concerns. Patient demonstrates progress as evidenced by continued engagement and responsiveness to treatment. Patient denies SI/HI/self-harm thoughts at the end of group.  Suicidal/Homicidal: Nowithout intent/plan  Plan: Pt will continue in PHP while working to decrease mood and anxiety symptoms, increase daily functioning, and increase ability to manage symptoms in a healthy manner.   Collaboration of Care: Medication Management AEB J Cyndie Chime  Patient/Guardian was advised Release of Information must be obtained prior to any record release in order to collaborate their care with an outside provider. Patient/Guardian was advised if they have not already done so to contact the registration department to sign all necessary forms in order for [place] to release information regarding their care.   Consent: Patient/Guardian gives verbal consent for treatment and assignment of benefits for services provided during this visit. Patient/Guardian expressed understanding and agreed to proceed.   Diagnosis: Severe episode of recurrent major depressive disorder, without psychotic features (HCC) [F33.2]    1. Severe episode of recurrent major depressive disorder, without psychotic features (HCC)   2. Unspecified mood (affective) disorder (HCC)      Donia Guiles, LCSW

## 2023-04-14 ENCOUNTER — Encounter (HOSPITAL_COMMUNITY): Payer: Self-pay

## 2023-04-14 ENCOUNTER — Other Ambulatory Visit (HOSPITAL_COMMUNITY): Payer: PRIVATE HEALTH INSURANCE | Admitting: Licensed Clinical Social Worker

## 2023-04-14 ENCOUNTER — Other Ambulatory Visit (HOSPITAL_COMMUNITY): Payer: PRIVATE HEALTH INSURANCE

## 2023-04-14 DIAGNOSIS — F411 Generalized anxiety disorder: Secondary | ICD-10-CM

## 2023-04-14 DIAGNOSIS — R4589 Other symptoms and signs involving emotional state: Secondary | ICD-10-CM

## 2023-04-14 DIAGNOSIS — F332 Major depressive disorder, recurrent severe without psychotic features: Secondary | ICD-10-CM

## 2023-04-14 DIAGNOSIS — F39 Unspecified mood [affective] disorder: Secondary | ICD-10-CM | POA: Diagnosis not present

## 2023-04-14 NOTE — Therapy (Signed)
[facility] PARTIAL HOSPITALIZATION PROGRAM [address] [place], [place], [address] Phone: [phone]   Fax:  [phone]  Occupational Therapy Treatment Virtual Visit via Video Note  I connected with [name] on 04/14/23 at  8:00 AM EDT by a video enabled telemedicine application and verified that I am speaking with the correct person using two identifiers.  Location: Patient: home Provider: office   I discussed the limitations of evaluation and management by telemedicine and the availability of in person appointments. The patient expressed understanding and agreed to proceed.    The patient was advised to call back or seek an in-person evaluation if the symptoms worsen or if the condition fails to improve as anticipated.  I provided 55 minutes of non-face-to-face time during this encounter.   Patient Details  Name: Alyssa Lutz MRN: [id number] Date of Birth: 05/26/1981 No data recorded  Encounter Date: 04/12/2023   OT End of Session - 04/14/23 1050     Visit Number 5    Number of Visits 20    Date for OT Re-Evaluation 05/06/23    OT Start Time 1200    OT Stop Time 1255    OT Time Calculation (min) 55 min             Past Medical History:  Diagnosis Date   ADHD    Anxiety    Depression    Graves disease    History of spinal fracture     Past Surgical History:  Procedure Laterality Date   APPENDECTOMY     CESAREAN SECTION      There were no vitals filed for this visit.   Subjective Assessment - 04/14/23 1049     Currently in Pain? No/denies    Pain Score 0-No pain                  Group Session:  S: Doing better today   O: In this communication group therapy session, facilitated by an occupational therapist, participants explored several key subtopics aimed at enhancing their interpersonal skills. The session began with a discussion on the use of "I" and "AND" statements, emphasizing personal  responsibility and constructive language in expressing feelings and needs. Participants then practiced active listening techniques to improve their ability to fully understand and respond to others. The group also delved into assertive communication, learning how to express themselves confidently and respectfully. Emotional regulation skills were addressed, providing strategies for managing emotions during interactions. Social skills training included role-playing scenarios to build confidence in various social contexts. Feedback and reflection were integral parts of the session, with participants offering and receiving constructive feedback to foster personal growth. The group identified common barriers to effective communication, such as anxiety, fear of judgment, and past negative experiences, and discussed strategies to overcome these challenges, including mindfulness practices and building a supportive network.   A: The patient actively participated in the group therapy session, demonstrating a high level of engagement and enthusiasm. They contributed to discussions on "I" and "AND" statements by sharing personal examples and insights. During the active listening exercises, the patient was attentive and provided thoughtful feedback to peers. They effectively practiced assertive communication techniques and showed a clear understanding of emotional regulation strategies. In social skills role-playing, the patient was confident and responsive, indicating a good grasp of the concepts. The patient also engaged in the feedback and reflection segment, offering constructive comments and showing receptivity to feedback from others. Their proactive approach and  willingness to explore personal barriers to communication suggest significant progress and motivation to improve interpersonal skills.   P: Continue to attend PHP OT group sessions 5x week for 4 weeks to promote daily structure, social engagement, and  opportunities to develop and utilize adaptive strategies to maximize functional performance in preparation for safe transition and integration back into school, work, and the community. Plan to address topic of sleep in next OT group session.                 OT Education - 04/14/23 1049     Education Details Communication 2              OT Short Term Goals - 04/06/23 0840       OT SHORT TERM GOAL #1   Title Client will develop and utilize a personalized coping toolbox containing at least five coping strategies to manage challenging situations, demonstrating their use in real-life scenarios by the end of therapy.    Time 4    Period Weeks    Status On-going    Target Date 05/06/23      OT SHORT TERM GOAL #2   Title By discharge, client will demonstrate the ability to adapt and modify routines when faced with unexpected events or changes, maintaining a balanced approach to daily tasks.    Status On-going      OT SHORT TERM GOAL #3   Title By the time of discharge, client will independently set, track, and make progress towards a long-term goal, demonstrating resilience in overcoming obstacles and seeking support when needed.    Status On-going                      Plan - 04/14/23 1050     Psychosocial Skills Interpersonal Interaction;Routines and Behaviors;Habits;Coping Strategies             Patient will benefit from skilled therapeutic intervention in order to improve the following deficits and impairments:       Psychosocial Skills: Interpersonal Interaction, Routines and Behaviors, Habits, Coping Strategies   Visit Diagnosis: Difficulty coping    Problem List Patient Active Problem List   Diagnosis Date Noted   Graves disease 04/06/2023   Unspecified mood (affective) disorder (HCC) 04/04/2023   MDD (major depressive disorder), recurrent episode, severe (HCC) 03/30/2023   GAD (generalized anxiety disorder) 03/30/2023    Ted Mcalpine, OT 04/14/2023, 10:50 AM Kerrin Champagne, OT  [facility] HOSPITALIZATION PROGRAM [address] [place], [place], [address] Phone: [phone]   Fax:  [phone]  Name: Alyssa Lutz MRN: [id number] Date of Birth: 18-Jun-1981

## 2023-04-14 NOTE — Therapy (Signed)
[facility] PARTIAL HOSPITALIZATION PROGRAM [address] [place], [place], [address] Phone: [phone]   Fax:  [phone]  Occupational Therapy Treatment Virtual Visit via Video Note  I connected with Alyssa Lutz on 04/14/23 at  8:00 AM EDT by a video enabled telemedicine application and verified that I am speaking with the correct person using two identifiers.  Location: Patient: home Provider: office   I discussed the limitations of evaluation and management by telemedicine and the availability of in person appointments. The patient expressed understanding and agreed to proceed.    The patient was advised to call back or seek an in-person evaluation if the symptoms worsen or if the condition fails to improve as anticipated.  I provided 55 minutes of non-face-to-face time during this encounter.   Patient Details  Name: Alyssa Lutz MRN: [id number] Date of Birth: [birth year] No data recorded  Encounter Date: 04/13/2023   OT End of Session - 04/14/23 2231     Visit Number 6    Number of Visits 20    Date for OT Re-Evaluation 05/06/23    OT Start Time 1200    OT Stop Time 1255    OT Time Calculation (min) 55 min             Past Medical History:  Diagnosis Date   ADHD    Anxiety    Depression    Graves disease    History of spinal fracture     Past Surgical History:  Procedure Laterality Date   APPENDECTOMY     CESAREAN SECTION      There were no vitals filed for this visit.   Subjective Assessment - 04/14/23 2230     Currently in Pain? No/denies    Pain Score 0-No pain                 Group Session:  S: Feeling better today  O: During the group therapy session, the occupational therapist discussed the impact of sleep disturbances on daily activities and overall health and wellbeing.   The OT also reviewed various types of sleep disorders, including insomnia, sleep apnea, restless leg syndrome, and  narcolepsy, and their associated symptoms. Strategies for managing and treating sleep disturbances were also discussed, such as establishing a consistent sleep routine, avoiding stimulants before bedtime, and engaging in relaxation techniques.   Today's group also included information on how sleep disturbances can cause fatigue, mood changes, cognitive impairment, and physical health problems, and emphasizes the importance of seeking prompt treatment to maintain overall health and wellbeing.   A: In today's session, the patient demonstrated active engagement with the topic of The Importance of Sleep. They eagerly asked questions, contributed personal experiences, and showcased a noticeable eagerness to apply the discussed principles. Their participation indicated not only a strong understanding of the subject matter but also an intrinsic motivation to implement better sleep practices in their daily routine. Based on their proactive involvement, it is assessed that the patient greatly benefited from today's treatment and will likely make efforts to incorporate the insights gained.    P: Continue to attend PHP OT group sessions 5x week for 4 weeks to promote daily structure, social engagement, and opportunities to develop and utilize adaptive strategies to maximize functional performance in preparation for safe transition and integration back into school, work, and the community. Plan to address topic of tbd in next OT group session.  OT Education - 04/14/23 2230     Education Details Sleep              OT Short Term Goals - 04/06/23 0840       OT SHORT TERM GOAL #1   Title Client will develop and utilize a personalized coping toolbox containing at least five coping strategies to manage challenging situations, demonstrating their use in real-life scenarios by the end of therapy.    Time 4    Period Weeks    Status On-going    Target Date 05/06/23      OT  SHORT TERM GOAL #2   Title By discharge, client will demonstrate the ability to adapt and modify routines when faced with unexpected events or changes, maintaining a balanced approach to daily tasks.    Status On-going      OT SHORT TERM GOAL #3   Title By the time of discharge, client will independently set, track, and make progress towards a long-term goal, demonstrating resilience in overcoming obstacles and seeking support when needed.    Status On-going                      Plan - 04/14/23 2231     Psychosocial Skills Interpersonal Interaction;Routines and Behaviors;Habits;Coping Strategies             Patient will benefit from skilled therapeutic intervention in order to improve the following deficits and impairments:       Psychosocial Skills: Interpersonal Interaction, Routines and Behaviors, Habits, Coping Strategies   Visit Diagnosis: Difficulty coping    Problem List Patient Active Problem List   Diagnosis Date Noted   Graves disease 04/06/2023   Unspecified mood (affective) disorder (HCC) 04/04/2023   MDD (major depressive disorder), recurrent episode, severe (HCC) 03/30/2023   GAD (generalized anxiety disorder) 03/30/2023    Ted Mcalpine, OT 04/14/2023, 10:32 PM  Kerrin Champagne, OT   [facility] HOSPITALIZATION PROGRAM [address] [place], [place], [address] Phone: [phone]   Fax:  [phone]  Name: Alyssa Lutz MRN: [id number] Date of Birth: 1981/03/05

## 2023-04-15 ENCOUNTER — Other Ambulatory Visit (HOSPITAL_COMMUNITY): Payer: PRIVATE HEALTH INSURANCE

## 2023-04-15 ENCOUNTER — Other Ambulatory Visit (HOSPITAL_COMMUNITY): Payer: PRIVATE HEALTH INSURANCE | Admitting: Licensed Clinical Social Worker

## 2023-04-15 DIAGNOSIS — R4589 Other symptoms and signs involving emotional state: Secondary | ICD-10-CM

## 2023-04-15 DIAGNOSIS — F332 Major depressive disorder, recurrent severe without psychotic features: Secondary | ICD-10-CM

## 2023-04-15 DIAGNOSIS — F39 Unspecified mood [affective] disorder: Secondary | ICD-10-CM | POA: Diagnosis not present

## 2023-04-15 DIAGNOSIS — F411 Generalized anxiety disorder: Secondary | ICD-10-CM

## 2023-04-18 ENCOUNTER — Other Ambulatory Visit (HOSPITAL_COMMUNITY): Payer: PRIVATE HEALTH INSURANCE

## 2023-04-19 ENCOUNTER — Encounter (HOSPITAL_COMMUNITY): Payer: Self-pay

## 2023-04-19 ENCOUNTER — Other Ambulatory Visit (HOSPITAL_COMMUNITY): Payer: PRIVATE HEALTH INSURANCE

## 2023-04-19 NOTE — Therapy (Signed)
[facility] PARTIAL HOSPITALIZATION PROGRAM [address] [place], [place], [address] Phone: [phone]   Fax:  [phone]  Occupational Therapy Treatment Virtual Visit via Video Note  I connected with [name] on 04/19/23 at  8:00 AM EDT by a video enabled telemedicine application and verified that I am speaking with the correct person using two identifiers.  Location: Patient: home Provider: office   I discussed the limitations of evaluation and management by telemedicine and the availability of in person appointments. The patient expressed understanding and agreed to proceed.    The patient was advised to call back or seek an in-person evaluation if the symptoms worsen or if the condition fails to improve as anticipated.  I provided 55 minutes of non-face-to-face time during this encounter.   Patient Details  Name: Alyssa Lutz MRN: [id number] Date of Birth: 1981-07-15 No data recorded  Encounter Date: 04/14/2023   OT End of Session - 04/19/23 1127     Visit Number 7    Number of Visits 20    Date for OT Re-Evaluation 05/06/23    OT Start Time 1200    OT Stop Time 1255    OT Time Calculation (min) 55 min             Past Medical History:  Diagnosis Date   ADHD    Anxiety    Depression    Graves disease    History of spinal fracture     Past Surgical History:  Procedure Laterality Date   APPENDECTOMY     CESAREAN SECTION      There were no vitals filed for this visit.   Subjective Assessment - 04/19/23 1126     Currently in Pain? No/denies    Pain Score 0-No pain                  Group Session:  S: Feeling better today   O: The objective of this presentation is to provide a comprehensive understanding of the concept of "motivation" and its role in human behavior and well-being. The content covers various theories of motivation, including intrinsic and extrinsic motivators, and explores the psychological  mechanisms that drive individuals to achieve goals, overcome obstacles, and make decisions. By diving into real-world applications, the presentation aims to offer actionable strategies for enhancing motivation in different life domains, such as work, relationships, and personal growth. Utilizing a multi-disciplinary approach, this presentation integrates insights from psychology, neuroscience, and behavioral economics to present a holistic view of motivation. The objective is not only to educate the audience about the complexities and driving forces behind motivation but also to equip them with practical tools and techniques to improve their own motivation levels. By the end of the presentation, attendees should have a well-rounded understanding of what motivates human actions and how to harness this knowledge for personal and professional betterment.   A:  The patient demonstrates a high level of engagement during the session, actively participating in discussions about motivation theories and their applicability to their own life. They show keen interest in learning new strategies to improve their motivation and even offer examples from their own experiences that align with the theories presented. Their level of self-awareness and willingness to invest in self-improvement suggest that they are well-positioned to benefit from the practical tools and techniques discussed. The patient's ability to articulate their goals and challenges further supports the likelihood of successfully implementing the strategies presented.    P: Continue  to attend PHP OT group sessions 5x week for 4 weeks to promote daily structure, social engagement, and opportunities to develop and utilize adaptive strategies to maximize functional performance in preparation for safe transition and integration back into school, work, and the community. Plan to address topic of pt 2 in next OT group session.                 OT  Education - 04/19/23 1127     Education Details Motivation              OT Short Term Goals - 04/06/23 0840       OT SHORT TERM GOAL #1   Title Client will develop and utilize a personalized coping toolbox containing at least five coping strategies to manage challenging situations, demonstrating their use in real-life scenarios by the end of therapy.    Time 4    Period Weeks    Status On-going    Target Date 05/06/23      OT SHORT TERM GOAL #2   Title By discharge, client will demonstrate the ability to adapt and modify routines when faced with unexpected events or changes, maintaining a balanced approach to daily tasks.    Status On-going      OT SHORT TERM GOAL #3   Title By the time of discharge, client will independently set, track, and make progress towards a long-term goal, demonstrating resilience in overcoming obstacles and seeking support when needed.    Status On-going                      Plan - 04/19/23 1127     Psychosocial Skills Interpersonal Interaction;Routines and Behaviors;Habits;Coping Strategies             Patient will benefit from skilled therapeutic intervention in order to improve the following deficits and impairments:       Psychosocial Skills: Interpersonal Interaction, Routines and Behaviors, Habits, Coping Strategies   Visit Diagnosis: Difficulty coping    Problem List Patient Active Problem List   Diagnosis Date Noted   Graves disease 04/06/2023   Unspecified mood (affective) disorder (HCC) 04/04/2023   MDD (major depressive disorder), recurrent episode, severe (HCC) 03/30/2023   GAD (generalized anxiety disorder) 03/30/2023    Ted Mcalpine, OT 04/19/2023, 11:28 AM  Kerrin Champagne, OT   [facility] HOSPITALIZATION PROGRAM [address] [place], [place], [address] Phone: [phone]   Fax:  [phone]  Name: GOLDINE GALLANT MRN: [id number] Date of Birth: 17-Jan-1981

## 2023-04-20 ENCOUNTER — Other Ambulatory Visit (HOSPITAL_COMMUNITY): Payer: PRIVATE HEALTH INSURANCE | Admitting: Licensed Clinical Social Worker

## 2023-04-20 ENCOUNTER — Other Ambulatory Visit (HOSPITAL_COMMUNITY): Payer: PRIVATE HEALTH INSURANCE

## 2023-04-20 DIAGNOSIS — R4589 Other symptoms and signs involving emotional state: Secondary | ICD-10-CM

## 2023-04-20 DIAGNOSIS — F411 Generalized anxiety disorder: Secondary | ICD-10-CM

## 2023-04-20 DIAGNOSIS — F39 Unspecified mood [affective] disorder: Secondary | ICD-10-CM

## 2023-04-20 DIAGNOSIS — F332 Major depressive disorder, recurrent severe without psychotic features: Secondary | ICD-10-CM

## 2023-04-20 MED ORDER — QUETIAPINE FUMARATE 25 MG PO TABS: 25.0000 mg | ORAL_TABLET | Freq: Every day | ORAL | 0 refills | Status: DC

## 2023-04-20 MED ORDER — OXCARBAZEPINE 150 MG PO TABS: 150.0000 mg | ORAL_TABLET | Freq: Every day | ORAL | 0 refills | Status: DC

## 2023-04-20 NOTE — Progress Notes (Signed)
BH MD/PA/NP OP Progress Note  Virtual Visit via Telephone Note  I connected with Alyssa Lutz on 04/20/23 at  9:00 AM EDT by a video enabled telemedicine application and verified that I am speaking with the correct person using two identifiers.  Location: Patient: Home Provider: Office   I discussed the limitations, risks, security and privacy concerns of performing an evaluation and management service by telephone and the availability of in person appointments. I also discussed with the patient that there may be a patient responsible charge related to this service. The patient expressed understanding and agreed to proceed.   I discussed the assessment and treatment plan with the patient. The patient was provided an opportunity to ask questions and all were answered. The patient agreed with the plan and demonstrated an understanding of the instructions.   The patient was advised to call back or seek an in-person evaluation if the symptoms worsen or if the condition fails to improve as anticipated.   Princess Bruins, DO Psych Resident, PGY-3   Name: Alyssa Lutz  MRN:  [id number]  Chief Complaint:  Chief Complaint  Patient presents with   Anxiety   HPI: Alyssa Lutz is a 42 y.o. female with PMH of MDD, possible Bipolar 2 d/o, suicide attempt, inpatient psych admission, Graves Disease (euthymic, on tapazole) who was admitted to [facility] for increased mood lability and chronic passive SI becoming more intrusive.   Only taking seroquel 25 mg, very worried about going up to 50 mg. She is sleeping on seroquel 25 mg at bedtime.   Also, tried only 2 doses of topamax, then stopped due to concern of side effects. So she did not continued.   Tried trileptal in the past, felt that it was helpful. Amenable to re-trial of trileptal.  Mood: Feeling more depressed than anger and anxiety. But overall her depression has improved since starting this program.   Sleep - sleeping more the past 2 days,  did a lot of physical activity over the weekend.   Appetite: good, no change  Labs - has not received them yet, planning on getting them this week. Did receive a call from [facility], she plans on calling them back.  Safety: Denied active and passive SI, HI, AVH, paranoia.   Review of Systems  Respiratory:  Negative for shortness of breath.   Cardiovascular:  Negative for chest pain.  Gastrointestinal:  Negative for nausea and vomiting.  Neurological:  Negative for dizziness and headaches.     Past Psychiatric History:  Previous dx: MDD, possible Bipolar 2 d/o, ADHD dx in college by college clinic provider INPT: 1x in 2004 while in college due to SA, where she Od'd on klonopin after breakup with boyfriend. Did not require medical hospitalization due to throwing up pills. Was prescribed Prozac or Lexapro at that time.  Dc'd from hospital Seroquel, Trazodone, trileptal, and klonopin.  Previous meds: [Lexapro, Cymbalta, Prozac, Wellbutrin]- recalls during the 10 years she was on these meds she would often hear from others that she had too much energy, had odd cleaning patterns late at night, more suicidal, and excessive shopping), Adderall Lamictal (ok, but poor compliance), trazodone, Valium, and Seroquel (helpful), Abilfy (failed had cognitive impairment) Outpatient: Previously at [facility] (due to insurance) and previous therapy No self harm hx  Etoh: rare, maybe 1x/ year, makes her more depressed THC: hx of use throughout adult life, began using more frequently the last 5 years and the last 1 year. She last uesd about  3 weeks ago. She  is having a harder time for this first time w/o THC and is scared by this. Patient was using Delta products daily in vaping pen. Husband has since thrown away [facility].  Denies other illicit subtances No hx of rehab  Family Psychiatric History:  Brother: Bipolar 1 d/o (hx of Lithium but had toxicity and is currently on Depakote and stable) Sister: Bipolar 2 on  Abilify M uncle: suicide in [place] M aunt: institionalized whole life, (unsure why) in [place]  Additional Social History:  - Sister is a Engineering geologist -is a Estate manager/land agent works for [employer] (not enjoying [place]) - Married with 6 yo  - Support system: son, husband, close friends - family is in the [place] area - family from [place] and [place]  Visit Diagnosis:    ICD-10-CM   1. Unspecified mood (affective) disorder (HCC)  F39 QUEtiapine (SEROQUEL) 25 MG tablet    OXcarbazepine (TRILEPTAL) 150 MG tablet       Past Medical History:  Past Medical History:  Diagnosis Date   ADHD    Anxiety    Depression    Graves disease    History of spinal fracture     Past Surgical History:  Procedure Laterality Date   APPENDECTOMY     CESAREAN SECTION      Family History:  Family History  Problem Relation Age of Onset   Bipolar disorder Sister    Bipolar disorder Brother    Personality disorder Cousin     Social History:  Social History   Socioeconomic History   Marital status: Married    Spouse name: Not on file   Number of children: 1   Years of education: Not on file   Highest education level: Master's degree (e.g., MA, MS, MEng, MEd, MSW, MBA)  Occupational History   Not on file  Tobacco Use   Smoking status: Never   Smokeless tobacco: Never  Vaping Use   Vaping status: Never Used  Substance and Sexual Activity   Alcohol use: Not Currently   Drug use: Never   Sexual activity: Yes  Other Topics Concern   Not on file  Social History Narrative   Not on file   Social Determinants of Health   Financial Resource Strain: Not on file  Food Insecurity: Not on file  Transportation Needs: Not on file  Physical Activity: Not on file  Stress: Not on file  Social Connections: Not on file    Allergies:  No Known Allergies  Metabolic Disorder Labs: No results found for: "HGBA1C", "MPG" No results found for: "PROLACTIN" No results found for: "CHOL",  "TRIG", "HDL", "CHOLHDL", "VLDL", "LDLCALC" No results found for: "TSH"  Therapeutic Level Labs: No results found for: "LITHIUM" No results found for: "VALPROATE" No results found for: "CBMZ"  Current Medications: Current Outpatient Medications  Medication Sig Dispense Refill   OXcarbazepine (TRILEPTAL) 150 MG tablet Take 1 tablet (150 mg total) by mouth at bedtime for 7 days. 7 tablet 0   BALZIVA 0.4-35 MG-MCG tablet Take 1 tablet by mouth daily.     benzonatate (TESSALON) 100 MG capsule Take 1 capsule (100 mg total) by mouth 3 (three) times daily as needed for cough. Do not take with alcohol or while driving or operating heavy machinery. May cause drowsiness. (Patient not taking: Reported on 04/13/2023) 21 capsule 0   calcium citrate (CALCITRATE - DOSED IN MG ELEMENTAL CALCIUM) 950 (200 Ca) MG tablet Take by mouth.     Cholecalciferol (  D 1000) 25 MCG (1000 UT) capsule Take by mouth.     diazepam (VALIUM) 2 MG tablet Take 1 tablet (2 mg total) by mouth daily as needed.     fluticasone (FLONASE) 50 MCG/ACT nasal spray Place 1 spray into both nostrils 2 (two) times daily. (Patient not taking: Reported on 04/13/2023) 16 g 2   methimazole (TAPAZOLE) 5 MG tablet Take 1 tablet (5 mg total) by mouth daily.     Multiple Vitamin (MULTIVITAMIN) capsule multivitamin     QUEtiapine (SEROQUEL) 25 MG tablet Take 1-2 tablets (25-50 mg total) by mouth at bedtime. 60 tablet 0   Vitamin D, Ergocalciferol, (DRISDOL) 1.25 MG (50000 UNIT) CAPS capsule Take by mouth.     No current facility-administered medications for this visit.   Psychiatric Specialty Exam: There were no vitals taken for this visit.  There is no height or weight on file to calculate BMI.  General Appearance: Casual, faily groomed  Eye Contact:  Good    Speech:  Clear, coherent, normal rate, hyperverbal  Volume:  Normal   Mood:  "Worried"  Affect:  Appropriate, congruent, full range  Thought Content: Logical, rumination  Suicidal  Thoughts: Denied active and passive SI    Thought Process:  Coherent, goal-directed, linear   Orientation:  A&Ox4   Memory:  Immediate good  Judgment:  Fair   Insight:  Fair   Concentration:  Attention and concentration good   Recall:  Good  Fund of Knowledge: Good  Language: Good, fluent  Psychomotor Activity: Grossly normal, limited by virtual encounter, possibly increased due to fidgeting from anxiety  Akathisia:  NA   AIMS (if indicated): NA   Assets:  Communication Skills Desire for Improvement Financial Resources/Insurance Housing Intimacy Leisure Time Physical Health Resilience Social Support Talents/Skills Transportation Vocational/Educational  ADL's:  Intact  Cognition: WNL  Sleep:  Fair    Screenings: GAD-7    Advertising copywriter from 03/30/2023 in [facility]AL [facility] PARTIAL HOSPITALIZATION PROGRAM  Total GAD-7 Score 20      PHQ2-9    Flowsheet Row Counselor from 04/13/2023 in BEHAVIORAL HEALTH PARTIAL HOSPITALIZATION PROGRAM Counselor from 03/30/2023 in BEHAVIORAL HEALTH PARTIAL HOSPITALIZATION PROGRAM  PHQ-2 Total Score 3 5  PHQ-9 Total Score 17 21      Flowsheet Row Counselor from 04/13/2023 in BEHAVIORAL HEALTH PARTIAL HOSPITALIZATION PROGRAM Counselor from 03/30/2023 in BEHAVIORAL HEALTH PARTIAL HOSPITALIZATION PROGRAM ED from 03/29/2023 in [facility]  C-SSRS RISK CATEGORY Error: Question 6 not populated High Risk No Risk       Assessment and Plan:   Unspecified mood disorder (rule out bipolar 2 disorder)  GAD Main symptoms of concern are irritability, mood lability, and acute change in chronic passive SI to now more intrusive. Unclear etiology, suspect this is due to increased cannabis use. Other wondering if this could be severe anxiety causing irritability rather than bipolar d/o, but patient does have strong family history of bipolar spectrum disorder. For this reason will treat with antipsychotic and mood stabilizer.   Seroquel 50 mg at bedtime was helpful with anxiety and sleep, however she reported multiple somatic sxs when increased to 75 mg, suspect somatization due to severe anxiety rather than actual TD or dystonia since seroquel is one of the least likely to cause these sxs and she is also at a very low dose.  Patient continues to be very anxious about medications, now declining to take topamax. She is willing to try trileptal. Continued Seroquel 25-50 mg at bedtime D/C  topamax 25 mg daily  STARTED trileptal 150 mg qHS  Cannabis use Last time used was about 3 weeks ago. Encouraged cessation  Graves' disease Followed by endocrinologist in [facility] Continued home Tapazole Ambulatory PCP referral-[facility] Labs: CBC, CMP, TSH, free T4, free T3, lipid panel, A1c  Moderate VitD insufficiency Repletion per endocrinologist  Collaboration of Care: Patient is to continue therapy and follow-up with their outpatient provider  Patient/Guardian was advised Release of Information must be obtained prior to any record release in order to collaborate their care with an outside provider. Patient/Guardian was advised if they have not already done so to contact the registration department to sign all necessary forms in order for [place] to release information regarding their care.   Consent: Patient/Guardian gives verbal consent for treatment and assignment of benefits for services provided during this visit. Patient/Guardian expressed understanding and agreed to proceed.   Princess Bruins, DO Psych Resident, PGY-3 04/20/2023, 7:53 PM

## 2023-04-21 ENCOUNTER — Other Ambulatory Visit (HOSPITAL_COMMUNITY): Payer: PRIVATE HEALTH INSURANCE

## 2023-04-21 ENCOUNTER — Encounter (HOSPITAL_COMMUNITY): Payer: Self-pay

## 2023-04-21 ENCOUNTER — Other Ambulatory Visit (HOSPITAL_COMMUNITY): Payer: PRIVATE HEALTH INSURANCE | Admitting: Licensed Clinical Social Worker

## 2023-04-21 DIAGNOSIS — F39 Unspecified mood [affective] disorder: Secondary | ICD-10-CM | POA: Diagnosis not present

## 2023-04-21 DIAGNOSIS — F332 Major depressive disorder, recurrent severe without psychotic features: Secondary | ICD-10-CM

## 2023-04-21 DIAGNOSIS — F411 Generalized anxiety disorder: Secondary | ICD-10-CM

## 2023-04-21 NOTE — Progress Notes (Signed)
Met with patient, through virtual session, as she presented with appropriate affect, level mood and denied any current suicidal or homicidal ideations, plan, intent or means to want to harm self or others at this time.  Patient reported starting PHP after going to the Behavioral [facility] Urgent Care to get something to help with her mood and an episode the day prior where she was stating to her husband and in front of her 42 year old she wanted to kill herself and just could not control her anger and emotions at that time.  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  Patient stated she had a suicide attempt 20 years previously and has been in and out of services for herself over the past 20 years, not sure if she possibly has a Bipolar disorder as she reports a strong family history with 2 siblings currently diagnosed. 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  Patient stated she was once in pain management for this but was put out because she had lied to them about her job role but was now considering retrying to help with daily pain.  Patient stated she felt she was in a place that was actively seeking help now and more open as the reason she also came into PHP. Pt also admited to a difficult past year with husband after an affair in their relationship but still working through this currently.  Patient denied any pain today and rated her Depression a 6-7 out of 10, with 10 being the worst and 1 none at all.  Patient reported her overall depression and anxiety have improved with current medications and PHP services but she is a little worried about later returning to work.  Patient will work with [facility] staff to help set up  follow up services after finishing PHP, due to her recent change in insurance and further needed services.  Patient stable at this time with no other issues or concerns noted and will contact this nurse or PHP staff with any worsening of symptoms or needs identified.

## 2023-04-21 NOTE — Therapy (Signed)
[facility] PARTIAL HOSPITALIZATION PROGRAM [address] [place], [place], [address] Phone: [phone]   Fax:  [phone]  Occupational Therapy Treatment Virtual Visit via Video Note  I connected with Alyssa Lutz on 04/21/23 at  8:00 AM EDT by a video enabled telemedicine application and verified that I am speaking with the correct person using two identifiers.  Location: Patient: home Provider: office   I discussed the limitations of evaluation and management by telemedicine and the availability of in person appointments. The patient expressed understanding and agreed to proceed.    The patient was advised to call back or seek an in-person evaluation if the symptoms worsen or if the condition fails to improve as anticipated.  I provided 55 minutes of non-face-to-face time during this encounter.   Patient Details  Name: Alyssa Lutz MRN: [id number] Date of Birth: Mar 04, 1981 No data recorded  Encounter Date: 04/20/2023   OT End of Session - 04/21/23 1103     Visit Number 8    Number of Visits 20    Date for OT Re-Evaluation 05/06/23    OT Start Time 1200    OT Stop Time 1255    OT Time Calculation (min) 55 min             Past Medical History:  Diagnosis Date   ADHD    Anxiety    Depression    Graves disease    History of spinal fracture     Past Surgical History:  Procedure Laterality Date   APPENDECTOMY     CESAREAN SECTION      There were no vitals filed for this visit.   Subjective Assessment - 04/21/23 1103     Currently in Pain? No/denies    Pain Score 0-No pain                 Group Session:  S: A bit better today.   O:  During today's OT group session, the patient participated in an educational segment about the importance of goal-setting and the application of the SMART framework to enhance daily life, particularly focusing on ADLs and iADLs. The session began with five open-ended pre-session  questions that facilitated group discussion and introspection about their current relationship with goals. Following the introduction and educational segment, participants engaged in brainstorming and group discussions to devise hypothetical SMART goals. The session concluded with five post-session questions to reinforce understanding and facilitate reflection. Throughout the session, there was a range of engagement levels noted among the participants.   A: Patient was present during the session but displayed passive engagement. They listened to the discussions but contributed minimally when prompted. When asked about personal experiences or thoughts related to the topic, their responses were brief and lacked depth. It was challenging to ascertain their understanding or the potential application of the SMART framework in their life. Further individual assessment or one-on-one discussions might be beneficial to explore any barriers to engagement or understanding of the material presented.   P: Continue to attend PHP OT group sessions 5x week for 4 weeks to promote daily structure, social engagement, and opportunities to develop and utilize adaptive strategies to maximize functional performance in preparation for safe transition and integration back into school, work, and the community. Plan to address topic of tbd in next OT group session.                  OT Education - 04/21/23 1103  Education Details SMART Goals              OT Short Term Goals - 04/06/23 0840       OT SHORT TERM GOAL #1   Title Client will develop and utilize a personalized coping toolbox containing at least five coping strategies to manage challenging situations, demonstrating their use in real-life scenarios by the end of therapy.    Time 4    Period Weeks    Status On-going    Target Date 05/06/23      OT SHORT TERM GOAL #2   Title By discharge, client will demonstrate the ability to adapt and  modify routines when faced with unexpected events or changes, maintaining a balanced approach to daily tasks.    Status On-going      OT SHORT TERM GOAL #3   Title By the time of discharge, client will independently set, track, and make progress towards a long-term goal, demonstrating resilience in overcoming obstacles and seeking support when needed.    Status On-going                      Plan - 04/21/23 1103     Psychosocial Skills Interpersonal Interaction;Routines and Behaviors;Habits;Coping Strategies             Patient will benefit from skilled therapeutic intervention in order to improve the following deficits and impairments:       Psychosocial Skills: Interpersonal Interaction, Routines and Behaviors, Habits, Coping Strategies   Visit Diagnosis: Difficulty coping    Problem List Patient Active Problem List   Diagnosis Date Noted   Graves disease 04/06/2023   Unspecified mood (affective) disorder (HCC) 04/04/2023   MDD (major depressive disorder), recurrent episode, severe (HCC) 03/30/2023   GAD (generalized anxiety disorder) 03/30/2023    Alyssa Lutz, OT 04/21/2023, 11:03 AM  Kerrin Champagne, OT   [facility] PROGRAM [address] [place], [place], [address] Phone: [phone]   Fax:  [phone]  Name: Alyssa Lutz MRN: [id number] Date of Birth: September 29, 1980

## 2023-04-22 ENCOUNTER — Other Ambulatory Visit (HOSPITAL_COMMUNITY): Payer: PRIVATE HEALTH INSURANCE

## 2023-04-22 ENCOUNTER — Encounter (HOSPITAL_COMMUNITY): Payer: Self-pay

## 2023-04-22 ENCOUNTER — Other Ambulatory Visit (HOSPITAL_COMMUNITY): Payer: PRIVATE HEALTH INSURANCE | Admitting: Licensed Clinical Social Worker

## 2023-04-22 ENCOUNTER — Telehealth (HOSPITAL_COMMUNITY): Payer: Self-pay | Admitting: Professional

## 2023-04-22 DIAGNOSIS — F332 Major depressive disorder, recurrent severe without psychotic features: Secondary | ICD-10-CM

## 2023-04-22 DIAGNOSIS — F39 Unspecified mood [affective] disorder: Secondary | ICD-10-CM | POA: Diagnosis not present

## 2023-04-22 DIAGNOSIS — F411 Generalized anxiety disorder: Secondary | ICD-10-CM

## 2023-04-22 DIAGNOSIS — R4589 Other symptoms and signs involving emotional state: Secondary | ICD-10-CM

## 2023-04-22 NOTE — Therapy (Signed)
[facility] PARTIAL HOSPITALIZATION PROGRAM [address] [place], [place], [address] Phone: [phone]   Fax:  [phone]  Occupational Therapy Treatment Virtual Visit via Video Note  I connected with [name] on 04/22/23 at  8:00 AM EDT by a video enabled telemedicine application and verified that I am speaking with the correct person using two identifiers.  Location: Patient: home Provider: office   I discussed the limitations of evaluation and management by telemedicine and the availability of in person appointments. The patient expressed understanding and agreed to proceed.    The patient was advised to call back or seek an in-person evaluation if the symptoms worsen or if the condition fails to improve as anticipated.  I provided 55 minutes of non-face-to-face time during this encounter.   Patient Details  Name: Alyssa Lutz MRN: [id number] Date of Birth: 1981/04/15 No data recorded  Encounter Date: 04/15/2023   OT End of Session - 04/22/23 1057     Visit Number 9    Number of Visits 20    Date for OT Re-Evaluation 05/06/23    OT Start Time 1200    OT Stop Time 1255    OT Time Calculation (min) 55 min             Past Medical History:  Diagnosis Date   ADHD    Anxiety    Depression    Graves disease    History of spinal fracture     Past Surgical History:  Procedure Laterality Date   APPENDECTOMY     CESAREAN SECTION      There were no vitals filed for this visit.   Subjective Assessment - 04/22/23 1057     Currently in Pain? No/denies    Pain Score 0-No pain                Group Session:  S: doing better today.   O: The objective of this presentation is to provide a comprehensive understanding of the concept of "motivation" and its role in human behavior and well-being. The content covers various theories of motivation, including intrinsic and extrinsic motivators, and explores the psychological  mechanisms that drive individuals to achieve goals, overcome obstacles, and make decisions. By diving into real-world applications, the presentation aims to offer actionable strategies for enhancing motivation in different life domains, such as work, relationships, and personal growth. Utilizing a multi-disciplinary approach, this presentation integrates insights from psychology, neuroscience, and behavioral economics to present a holistic view of motivation. The objective is not only to educate the audience about the complexities and driving forces behind motivation but also to equip them with practical tools and techniques to improve their own motivation levels. By the end of the presentation, attendees should have a well-rounded understanding of what motivates human actions and how to harness this knowledge for personal and professional betterment.   A: The patient attends the session but shows minimal active involvement in the discussion or activities. They listen to the information provided but do not ask questions or volunteer personal experiences that could deepen their understanding of motivation. Their body language, such as limited eye contact and minimal verbal feedback, indicates a passive engagement level, suggesting a potential barrier to fully benefiting from the educational content. The patient's passive approach may require more directed intervention to ensure they grasp and can apply the principles of motivation to their life.   P: Continue to attend PHP OT group sessions 5x week for 4  weeks to promote daily structure, social engagement, and opportunities to develop and utilize adaptive strategies to maximize functional performance in preparation for safe transition and integration back into school, work, and the community. Plan to address topic of tbd in next OT group session.                   OT Education - 04/22/23 1057     Education Details Motivation 2               OT Short Term Goals - 04/06/23 0840       OT SHORT TERM GOAL #1   Title Client will develop and utilize a personalized coping toolbox containing at least five coping strategies to manage challenging situations, demonstrating their use in real-life scenarios by the end of therapy.    Time 4    Period Weeks    Status On-going    Target Date 05/06/23      OT SHORT TERM GOAL #2   Title By discharge, client will demonstrate the ability to adapt and modify routines when faced with unexpected events or changes, maintaining a balanced approach to daily tasks.    Status On-going      OT SHORT TERM GOAL #3   Title By the time of discharge, client will independently set, track, and make progress towards a long-term goal, demonstrating resilience in overcoming obstacles and seeking support when needed.    Status On-going                      Plan - 04/22/23 1057     Psychosocial Skills Interpersonal Interaction;Routines and Behaviors;Habits;Coping Strategies             Patient will benefit from skilled therapeutic intervention in order to improve the following deficits and impairments:       Psychosocial Skills: Interpersonal Interaction, Routines and Behaviors, Habits, Coping Strategies   Visit Diagnosis: Difficulty coping    Problem List Patient Active Problem List   Diagnosis Date Noted   Graves disease 04/06/2023   Unspecified mood (affective) disorder (HCC) 04/04/2023   MDD (major depressive disorder), recurrent episode, severe (HCC) 03/30/2023   GAD (generalized anxiety disorder) 03/30/2023    Ted Mcalpine, OT 04/22/2023, 10:58 AM  Kerrin Champagne, OT   [facility] HOSPITALIZATION PROGRAM [address] [place], [place], [address] Phone: [phone]   Fax:  [phone]  Name: EMILY CROOKER MRN: [id number] Date of Birth: [birth year]

## 2023-04-22 NOTE — Telephone Encounter (Signed)
[name] was going to call pt due to pt being out of group. Pt returned to group so call not necessary.

## 2023-04-22 NOTE — Psych (Signed)
Virtual Visit via Video Note  I connected with Alyssa Lutz on 04/22/23 at  9:00 AM EDT by a video enabled telemedicine application and verified that I am speaking with the correct person using two identifiers.  Location: Patient: pt's home in [place], [place] Provider: clinical home office in [place], [place]   I discussed the limitations of evaluation and management by telemedicine and the availability of in person appointments. The patient expressed understanding and agreed to proceed.   I discussed the assessment and treatment plan with the patient. The patient was provided an opportunity to ask questions and all were answered. The patient agreed with the plan and demonstrated an understanding of the instructions.   The patient was advised to call back or seek an in-person evaluation if the symptoms worsen or if the condition fails to improve as anticipated.  I provided 240 minutes of non-face-to-face time during this encounter.   Wyvonnia Lora, LCSW   [facility] PHP THERAPIST PROGRESS NOTE  Alyssa Lutz [id number]   Session Time: 9:00 am - 10:00 am  Participation Level: Active  Behavioral Response: CasualAlertDepressed  Type of Therapy: Group Therapy  Treatment Goals addressed: Coping  Progress Towards Goals: Progressing  Interventions: CBT, DBT, Solution Focused, Strength-based, Supportive, and Reframing  Therapist Response: Clinician led check-in regarding current stressors and situation, and review of patient completed daily inventory. Clinician utilized active listening and empathetic response and validated patient emotions. Clinician facilitated processing group on pertinent issues.?   Summary: Patient arrived within time allowed. Patient rates her mood at a 5 or 6 on a scale of 1-10 with 10 being best. "It's my son's birthday today, which is a good thing but also a marker of my pain, so seven years now." She states they are going to be celebrating today. She  states she slept okay last night and her eating is fine. She denies SI/SH thoughts and denies a topic for processing. Pt able to process.?Pt engaged in discussion.?      Session Time: 10:00 am - 11:00 am  Participation Level: Active  Behavioral Response: CasualAlertAnxious and Depressed  Type of Therapy: Group Therapy  Treatment Goals addressed: Coping  Progress Towards Goals: Progressing  Interventions: CBT, DBT, Solution Focused, Strength-based, Supportive, and Reframing  Therapist Response: Clinician led group on emotional validation and shame regarding depression and anxiety. Clinician utilized CBT principles to inform discussion.   Summary: Pt engaged in discussion. She shares her experience with chronic pain and being invalidated by others, particularly her father. She verbalizes support for her peers.     Session Time: 11:00 am - 12:00 pm  Participation Level: Active  Behavioral Response: CasualAlertAnxious and Depressed  Type of Therapy: Group Therapy  Treatment Goals addressed: Coping  Progress Towards Goals: Progressing  Interventions: CBT, DBT, Solution Focused, Strength-based, Supportive, and Reframing  Therapist Response: Clinician led group on the topic of shame and how it impacts mental health. Clinician showed the Ted Talk by Dewain Penning entitled "The Power of Vulnerability" and utilized CBT principles to inform discussion.   Summary: Pt engaged in discussion and demonstrated good insight into the subject matter.    Session Time: 12:00 pm - 1:00 pm  Participation Level: Active  Behavioral Response: CasualAlertAnxious and Depressed  Type of Therapy: Group Therapy  Treatment Goals addressed: Coping  Progress Towards Goals: Progressing  Interventions: CBT, DBT, Solution Focused, Strength-based, Supportive, and Reframing  Therapist Response: 12:00 - 12:50 pm: See OT note. 12:50 - 1:00 pm: Clinician led  check-out. Clinician assessed for immediate  needs, medication compliance and efficacy, and safety concerns?  Summary: 12:00 - 12:50 pm: See OT note 12:50 - 1:00 pm: At check-out, patient contracts for safety.?Patient demonstrates progress as evidenced by her continued engagement and by being receptive to treatment. Patient denies SI/HI/self-harm thoughts at the end of group and agrees to seek help should those thoughts/feelings occur.?   Suicidal/Homicidal: Nowithout intent/plan  Plan: ?Pt will continue in PHP and medication management while continuing to work on decreasing depression symptoms,?SI, and anxiety symptoms,?and increasing the ability to self manage symptoms.   Collaboration of Care: Medication Management AEB Hillery Jacks NP and Princess Bruins DO  Patient/Guardian was advised Release of Information must be obtained prior to any record release in order to collaborate their care with an outside provider. Patient/Guardian was advised if they have not already done so to contact the registration department to sign all necessary forms in order for [place] to release information regarding their care.   Consent: Patient/Guardian gives verbal consent for treatment and assignment of benefits for services provided during this visit. Patient/Guardian expressed understanding and agreed to proceed.   Diagnosis: Severe episode of recurrent major depressive disorder, without psychotic features (HCC) [F33.2]    1. Severe episode of recurrent major depressive disorder, without psychotic features (HCC)   2. GAD (generalized anxiety disorder)       Wyvonnia Lora, LCSW 04/22/2023

## 2023-04-23 NOTE — Psych (Signed)
Virtual Visit via Video Note  I connected with Alyssa Lutz on 04/14/23 at  9:00 AM EDT by a video enabled telemedicine application and verified that I am speaking with the correct person using two identifiers.  Location: Patient: patient home Provider: clinical home office   I discussed the limitations of evaluation and management by telemedicine and the availability of in person appointments. The patient expressed understanding and agreed to proceed.  I discussed the assessment and treatment plan with the patient. The patient was provided an opportunity to ask questions and all were answered. The patient agreed with the plan and demonstrated an understanding of the instructions.   The patient was advised to call back or seek an in-person evaluation if the symptoms worsen or if the condition fails to improve as anticipated.  Pt was provided 240 minutes of non-face-to-face time during this encounter.   Alyssa Guiles, LCSW   [facility] PHP THERAPIST PROGRESS NOTE  Alyssa Lutz [id number]  Session Time: 9:00 - 10:00  Participation Level: Active  Behavioral Response: CasualAlertDepressed  Type of Therapy: Group Therapy  Treatment Goals addressed: Coping  Progress Towards Goals: Progressing  Interventions: CBT, DBT, Supportive, and Reframing  Summary: Alyssa Lutz is a 42 y.o. female who presents with anxiety and mood symptoms. Clinician led check-in regarding current stressors and situation, and review of patient completed daily inventory. Clinician utilized active listening and empathetic response and validated patient emotions. Clinician facilitated processing group on pertinent issues.?    Therapist Response:  Patient arrived within time allowed. Patient rates her mood at a 3 on a scale of 1-10 with 10 being best. Pt states she feels "drained." Pt states she slept 6 hours and ate 3x. Pt reports having an anger episode yesterday and led to crying, hopelessness, and despair. Pt  states feeling she was medically gaslit and her medication interactions not taken into account. Pt is able to process and share with increased vulnerability. Group provided support.  Patient able to process. Patient engaged in discussion.            Session Time: 10:00 am - 11:00 am   Participation Level: Active   Behavioral Response: CasualAlertDepressed   Type of Therapy: Group Therapy   Treatment Goals addressed: Coping   Progress Towards Goals: Progressing   Interventions: CBT, DBT, Solution Focused, Strength-based, Supportive, and Reframing   Therapist Response: [name] led discussion on rumination: and how it can affect [place] negatively. Group members share topics, situations, and times in which rumination is most problem problematic for them. [name] encouraged pt's to consider DBT distraction and STOP skills or CBT thought challenging to manage rumination.    Therapist Response: Pt engaged in discussion and identifies when rumination is most problematic.           Session Time: 11:00 -12:00   Participation Level: Active   Behavioral Response: CasualAlertDepressed   Type of Therapy: Group Therapy   Treatment Goals addressed: Coping   Progress Towards Goals: Progressing   Interventions: CBT, DBT, Solution Focused, Strength-based, Supportive, and Reframing   Summary: [name] led discussion on making difficult decisions. Group members discussed decisions they are struggling with. [name] utilized CBT thought challenging and DBT walking the middle path to inform discussion.   Therapist Response: Pt engaged in discussion.             Session Time: 12:00 -1:00   Participation Level: Active   Behavioral Response: CasualAlertDepressed   Type of Therapy: Group therapy  Treatment Goals addressed: Coping   Progress Towards Goals: Progressing   Interventions: OT group   Summary: 12:00 - 12:50: Occupational Therapy group with cln E. Hollan.  12:50 - 1:00 Clinician assessed  for immediate needs, medication compliance and efficacy, and safety concerns.   Therapist Response: 12:00 - 12:50: See note 12:50 - 1:00 pm: At check-out, patient reports no immediate concerns. Patient demonstrates progress as evidenced by continued engagement and responsiveness to treatment. Patient denies SI/HI/self-harm thoughts at the end of group.    Suicidal/Homicidal: Nowithout intent/plan  Plan: Pt will continue in PHP while working to decrease mood and anxiety symptoms, increase daily functioning, and increase ability to manage symptoms in a healthy manner.   Collaboration of Care: Medication Management AEB J Cyndie Chime  Patient/Guardian was advised Release of Information must be obtained prior to any record release in order to collaborate their care with an outside provider. Patient/Guardian was advised if they have not already done so to contact the registration department to sign all necessary forms in order for [place] to release information regarding their care.   Consent: Patient/Guardian gives verbal consent for treatment and assignment of benefits for services provided during this visit. Patient/Guardian expressed understanding and agreed to proceed.   Diagnosis: Severe episode of recurrent major depressive disorder, without psychotic features (HCC) [F33.2]    1. Severe episode of recurrent major depressive disorder, without psychotic features (HCC)   2. GAD (generalized anxiety disorder)      Alyssa Guiles, LCSW

## 2023-04-23 NOTE — Psych (Signed)
Virtual Visit via Video Note  I connected with Alyssa Lutz on 04/15/23 at  9:00 AM EDT by a video enabled telemedicine application and verified that I am speaking with the correct person using two identifiers.  Location: Patient: patient home Provider: clinical home office   I discussed the limitations of evaluation and management by telemedicine and the availability of in person appointments. The patient expressed understanding and agreed to proceed.  I discussed the assessment and treatment plan with the patient. The patient was provided an opportunity to ask questions and all were answered. The patient agreed with the plan and demonstrated an understanding of the instructions.   The patient was advised to call back or seek an in-person evaluation if the symptoms worsen or if the condition fails to improve as anticipated.  Pt was provided 240 minutes of non-face-to-face time during this encounter.   Donia Guiles, LCSW   [facility] PHP THERAPIST PROGRESS NOTE  DAISEY RAMAKRISHNAN [id number]  Session Time: 9:00 - 10:00  Participation Level: Active  Behavioral Response: CasualAlertDepressed  Type of Therapy: Group Therapy  Treatment Goals addressed: Coping  Progress Towards Goals: Progressing  Interventions: CBT, DBT, Supportive, and Reframing  Summary: Alyssa Lutz is a 42 y.o. female who presents with anxiety and mood symptoms. Clinician led check-in regarding current stressors and situation, and review of patient completed daily inventory. Clinician utilized active listening and empathetic response and validated patient emotions. Clinician facilitated processing group on pertinent issues.?    Therapist Response:  Patient arrived within time allowed. Patient rates her mood at a 5.5 on a scale of 1-10 with 10 being best. Pt states she feels "better than yesterday." Pt states she slept 4.5 hours and ate 3x. Pt reports she intentionally focused on being productive yesterday after  feeling emotional during group. Pt reports feeling increased hope after sharing struggles with group. Pt identifies passive SI and feeling "sad and pathetic." Pt denies plan/intent and that she was able to redirect her thoughts. Patient able to process. Patient engaged in discussion.            Session Time: 10:00 am - 11:00 am   Participation Level: Active   Behavioral Response: CasualAlertDepressed   Type of Therapy: Group Therapy   Treatment Goals addressed: Coping   Progress Towards Goals: Progressing   Interventions: CBT, DBT, Solution Focused, Strength-based, Supportive, and Reframing   Therapist Response: [name] led discussion on ways to manage stressors and feelings over the weekend. Group members  brainstormed things to do over the weekend for multiple levels of energy, access, and moods. [name] reviewed crisis services should they be needed and provided pt's with the text crisis line, mobile crisis, national suicide hotline, [facility] 24/7 line, and information on [facility].      Therapist Response: Pt engaged in discussion and is able to identify 3 ideas of what to do over the weekend to keep their mind engaged.           Session Time: 11:00 -12:00   Participation Level: Active   Behavioral Response: CasualAlertDepressed   Type of Therapy: Group Therapy   Treatment Goals addressed: Coping   Progress Towards Goals: Progressing   Interventions: CBT, DBT, Solution Focused, Strength-based, Supportive, and Reframing   Summary: [name] led discussion on knowing our limits, especially when they need to be adjusted due to fluctuation in our energy, mood, and functioning. [name] discussed limits may be taking a break from a conversation, scheduling less activities during  the day, and/or being willing to stop and rest when needed. Group members shared times they have been exhausted or overwhelmed recently, and cln helped work back to determine where setting a limit may have prevented them  from getting to that point. Group discussed barriers to setting limits for themselves and brainstormed ways to address those barriers.   Therapist Response:  Pt engaged in discussion and is able to identify where they could set limits to help their recovery.           Session Time: 12:00 -1:00   Participation Level: Active   Behavioral Response: CasualAlertDepressed   Type of Therapy: Group therapy   Treatment Goals addressed: Coping   Progress Towards Goals: Progressing   Interventions: OT group   Summary: 12:00 - 12:50: Occupational Therapy group with cln E. Hollan.  12:50 - 1:00 Clinician assessed for immediate needs, medication compliance and efficacy, and safety concerns.   Therapist Response: 12:00 - 12:50: See note 12:50 - 1:00 pm: At check-out, patient reports no immediate concerns. Patient demonstrates progress as evidenced by continued engagement and responsiveness to treatment. Patient denies SI/HI/self-harm thoughts at the end of group.    Suicidal/Homicidal: Nowithout intent/plan  Plan: Pt will continue in PHP while working to decrease mood and anxiety symptoms, increase daily functioning, and increase ability to manage symptoms in a healthy manner.   Collaboration of Care: Medication Management AEB J Cyndie Chime  Patient/Guardian was advised Release of Information must be obtained prior to any record release in order to collaborate their care with an outside provider. Patient/Guardian was advised if they have not already done so to contact the registration department to sign all necessary forms in order for [place] to release information regarding their care.   Consent: Patient/Guardian gives verbal consent for treatment and assignment of benefits for services provided during this visit. Patient/Guardian expressed understanding and agreed to proceed.   Diagnosis: Severe episode of recurrent major depressive disorder, without psychotic features (HCC) [F33.2]    1. Severe  episode of recurrent major depressive disorder, without psychotic features (HCC)   2. GAD (generalized anxiety disorder)      Donia Guiles, LCSW

## 2023-04-23 NOTE — Psych (Signed)
Virtual Visit via Video Note  I connected with Alyssa Lutz on 04/12/23 at  9:00 AM EDT by a video enabled telemedicine application and verified that I am speaking with the correct person using two identifiers.  Location: Patient: patient home Provider: clinical home office   I discussed the limitations of evaluation and management by telemedicine and the availability of in person appointments. The patient expressed understanding and agreed to proceed.  I discussed the assessment and treatment plan with the patient. The patient was provided an opportunity to ask questions and all were answered. The patient agreed with the plan and demonstrated an understanding of the instructions.   The patient was advised to call back or seek an in-person evaluation if the symptoms worsen or if the condition fails to improve as anticipated.  Pt was provided 240 minutes of non-face-to-face time during this encounter.   Alyssa Guiles, LCSW   [facility] BH PHP THERAPIST PROGRESS NOTE  Alyssa Lutz [id number]  Session Time: 9:00 - 10:00  Participation Level: Active  Behavioral Response: CasualAlertDepressed  Type of Therapy: Group Therapy  Treatment Goals addressed: Coping  Progress Towards Goals: Progressing  Interventions: CBT, DBT, Supportive, and Reframing  Summary: Alyssa Lutz is a 42 y.o. female who presents with anxiety and mood symptoms. Clinician led check-in regarding current stressors and situation, and review of patient completed daily inventory. Clinician utilized active listening and empathetic response and validated patient emotions. Clinician facilitated processing group on pertinent issues.?    Therapist Response:  Patient arrived within time allowed. Patient rates her mood at a 5.5 on a scale of 1-10 with 10 being best. Pt states she feels "ok mentally but my pain is bad." Pt states she slept 7 hours and ate 3x. Pt reports her son is at home with her this week and she is having  increased anxiety about being "enough" for him. Pt reports continued anxiety about medication and not being able to take her new medication yesterday as planned. Patient able to process. Patient minimally engaged in discussion.            Session Time: 10:00 am - 11:00 am   Participation Level: Active   Behavioral Response: CasualAlertDepressed   Type of Therapy: Group Therapy   Treatment Goals addressed: Coping   Progress Towards Goals: Progressing   Interventions: CBT, DBT, Solution Focused, Strength-based, Supportive, and Reframing   Therapist Response: [name] led discussion on accountability and the balance between taking responsibility and not beating ourselves up. Group members shared current consequences they are dealing with and how they are processing them. [name] encouraged pt's to utilize the Best Friend Test to make it easier to offer themselves kindness.    Therapist Response: Pt engaged in discussion and is able to process.           Session Time: 11:00 -12:00   Participation Level: Active   Behavioral Response: CasualAlertDepressed   Type of Therapy: Group Therapy   Treatment Goals addressed: Coping   Progress Towards Goals: Progressing   Interventions: CBT, DBT, Solution Focused, Strength-based, Supportive, and Reframing   Summary: [name] led discussion on healthy aggression substitutes. [name] discussed the benefits to discharging energy and adrenaline when feeling "revved up" in emotion and the importance of balancing that discharge with safety and lack if negative consequences. Group brainstormed different ways to channel aggression in a healthy way and shared ways that have worked for them in the past.   Therapist Response: Pt engaged in discussion and  identifies 3 options to try.            Session Time: 12:00 -1:00   Participation Level: Active   Behavioral Response: CasualAlertDepressed   Type of Therapy: Group therapy   Treatment Goals addressed:  Coping   Progress Towards Goals: Progressing   Interventions: OT group   Summary: 12:00 - 12:50: Occupational Therapy group with cln Alyssa Lutz.  12:50 - 1:00 Clinician assessed for immediate needs, medication compliance and efficacy, and safety concerns.   Therapist Response: 12:00 - 12:50: See note 12:50 - 1:00 pm: At check-out, patient reports no immediate concerns. Patient demonstrates progress as evidenced by continued engagement and responsiveness to treatment. Patient denies SI/HI/self-harm thoughts at the end of group.    Suicidal/Homicidal: Nowithout intent/plan  Plan: Pt will continue in PHP while working to decrease mood and anxiety symptoms, increase daily functioning, and increase ability to manage symptoms in a healthy manner.   Collaboration of Care: Medication Management AEB Alyssa Lutz  Patient/Guardian was advised Release of Information must be obtained prior to any record release in order to collaborate their care with an outside provider. Patient/Guardian was advised if they have not already done so to contact the registration department to sign all necessary forms in order for [place] to release information regarding their care.   Consent: Patient/Guardian gives verbal consent for treatment and assignment of benefits for services provided during this visit. Patient/Guardian expressed understanding and agreed to proceed.   Diagnosis: Severe episode of recurrent major depressive disorder, without psychotic features (HCC) [F33.2]    1. Severe episode of recurrent major depressive disorder, without psychotic features (HCC)   2. GAD (generalized anxiety disorder)      Alyssa Guiles, LCSW

## 2023-04-23 NOTE — Psych (Signed)
Virtual Visit via Video Note  I connected with Alyssa Lutz on 04/21/23 at  9:00 AM EDT by a video enabled telemedicine application and verified that I am speaking with the correct person using two identifiers.  Location: Patient: patient home Provider: clinical home office   I discussed the limitations of evaluation and management by telemedicine and the availability of in person appointments. The patient expressed understanding and agreed to proceed.  I discussed the assessment and treatment plan with the patient. The patient was provided an opportunity to ask questions and all were answered. The patient agreed with the plan and demonstrated an understanding of the instructions.   The patient was advised to call back or seek an in-person evaluation if the symptoms worsen or if the condition fails to improve as anticipated.  Pt was provided 240 minutes of non-face-to-face time during this encounter.   Donia Guiles, LCSW   [facility] BH PHP THERAPIST PROGRESS NOTE  Alyssa Lutz [id number]  Session Time: 9:00 - 10:00  Participation Level: Active  Behavioral Response: CasualAlertDepressed  Type of Therapy: Group Therapy  Treatment Goals addressed: Coping  Progress Towards Goals: Progressing  Interventions: CBT, DBT, Supportive, and Reframing  Summary: Alyssa Lutz is a 42 y.o. female who presents with anxiety and mood symptoms. Clinician led check-in regarding current stressors and situation, and review of patient completed daily inventory. Clinician utilized active listening and empathetic response and validated patient emotions. Clinician facilitated processing group on pertinent issues.?    Therapist Response:  Patient arrived within time allowed. Patient rates her mood at a 6 on a scale of 1-10 with 10 being best. Pt states she feels "okay." Pt states she slept 9 hours and ate 2x. Pt reports she feels a little brighter and credits her 2 days of sleep with the reason. Pt  reports she stopped herself from catastrophizing yesterday and felt a "prick" of pride.  Patient able to process. Patient minimally engaged in discussion.            Session Time: 10:00 am - 11:00 am   Participation Level: Active   Behavioral Response: CasualAlertDepressed   Type of Therapy: Group Therapy   Treatment Goals addressed: Coping   Progress Towards Goals: Progressing   Interventions: CBT, DBT, Solution Focused, Strength-based, Supportive, and Reframing   Therapist Response: [name] led discussion on trust and how to establish healthy trust. Group discussed common missteps such as disclosing personal information too soon, ignoring behaviors being displayed, inaccurately defining the relationship, and not giving people credit. [name] utilized CBT thought challenging principles to encourage pt's to rely on "evidence" versus feelings. Group members shared struggles they experience with trust.    Therapist Response: Pt engaged in discussion.          Session Time: 11:00 -12:00   Participation Level: Active   Behavioral Response: CasualAlertDepressed   Type of Therapy: Group Therapy   Treatment Goals addressed: Coping   Progress Towards Goals: Progressing   Interventions: CBT, DBT, Solution Focused, Strength-based, Supportive, and Reframing   Summary: [name] continued topic of boundaries. [name] discussed the different ways boundaries present: physical, emotional, intellectual, sexual, material, and time. Group talked about the ways in which each type presents for them and is a struggle.    Therapist Response:  Pt engaged in discussion and is able to identify when each type is most problematic for them.        Session Time: 12:00 -1:00   Participation Level: Active   Behavioral  Response: CasualAlertDepressed   Type of Therapy: Group therapy   Treatment Goals addressed: Coping   Progress Towards Goals: Progressing   Interventions: OT group   Summary: 12:00 - 12:50:  Occupational Therapy group with cln E. Hollan.  12:50 - 1:00 Clinician assessed for immediate needs, medication compliance and efficacy, and safety concerns.   Therapist Response: 12:00 - 12:50: See note 12:50 - 1:00 pm: At check-out, patient reports no immediate concerns. Patient demonstrates progress as evidenced by continued engagement and responsiveness to treatment. Patient denies SI/HI/self-harm thoughts at the end of group.    Suicidal/Homicidal: Nowithout intent/plan  Plan: Pt will continue in PHP while working to decrease mood and anxiety symptoms, increase daily functioning, and increase ability to manage symptoms in a healthy manner.   Collaboration of Care: Medication Management AEB J Cyndie Chime  Patient/Guardian was advised Release of Information must be obtained prior to any record release in order to collaborate their care with an outside provider. Patient/Guardian was advised if they have not already done so to contact the registration department to sign all necessary forms in order for [place] to release information regarding their care.   Consent: Patient/Guardian gives verbal consent for treatment and assignment of benefits for services provided during this visit. Patient/Guardian expressed understanding and agreed to proceed.   Diagnosis: Severe episode of recurrent major depressive disorder, without psychotic features (HCC) [F33.2]    1. Severe episode of recurrent major depressive disorder, without psychotic features (HCC)   2. GAD (generalized anxiety disorder)      Donia Guiles, LCSW

## 2023-04-23 NOTE — Psych (Signed)
Virtual Visit via Video Note  I connected with Alyssa Lutz on 04/13/23 at  9:00 AM EDT by a video enabled telemedicine application and verified that I am speaking with the correct person using two identifiers.  Location: Patient: patient home Provider: clinical home office   I discussed the limitations of evaluation and management by telemedicine and the availability of in person appointments. The patient expressed understanding and agreed to proceed.  I discussed the assessment and treatment plan with the patient. The patient was provided an opportunity to ask questions and all were answered. The patient agreed with the plan and demonstrated an understanding of the instructions.   The patient was advised to call back or seek an in-person evaluation if the symptoms worsen or if the condition fails to improve as anticipated.  Pt was provided 240 minutes of non-face-to-face time during this encounter.   Donia Guiles, LCSW   [facility] BH PHP THERAPIST PROGRESS NOTE  DYASIA Lutz [id number]  Session Time: 9:00 - 10:00  Participation Level: Active  Behavioral Response: CasualAlertDepressed  Type of Therapy: Group Therapy  Treatment Goals addressed: Coping  Progress Towards Goals: Progressing  Interventions: CBT, DBT, Supportive, and Reframing  Summary: Alyssa Lutz is a 42 y.o. female who presents with anxiety and mood symptoms. Clinician led check-in regarding current stressors and situation, and review of patient completed daily inventory. Clinician utilized active listening and empathetic response and validated patient emotions. Clinician facilitated processing group on pertinent issues.?    Therapist Response:  Patient arrived within time allowed. Patient rates her mood at a 4.5 on a scale of 1-10 with 10 being best. Pt states she feels "a little off." Pt states she slept 4.5 hours and ate 2x. Pt reports afternoon went well with her son and she left the house. Pt states the  evening deteriorated and she struggled with anxious rumination into the early hours of the morning. Pt struggled with self-judgment. Patient able to process. Patient minimally engaged in discussion.            Session Time: 10:00 am - 11:00 am   Participation Level: Active   Behavioral Response: CasualAlertDepressed   Type of Therapy: Group Therapy   Treatment Goals addressed: Coping   Progress Towards Goals: Progressing   Interventions: CBT, DBT, Solution Focused, Strength-based, Supportive, and Reframing   Therapist Response: [name] led processing group for pt's current struggles. Group members shared stressors and provided support and feedback. [name] brought in topics of boundaries, healthy relationships, and unhealthy thought processes to inform discussion.    Therapist Response:  Pt able to process and provide support to group.           Session Time: 11:00 -12:00   Participation Level: Active   Behavioral Response: CasualAlertDepressed   Type of Therapy: Group Therapy   Treatment Goals addressed: Coping   Progress Towards Goals: Progressing   Interventions: Strength-based, Supportive, and Reframing   Summary: [name] group with [name]   Therapist Response: Pt participated and engaged in discussion.              Session Time: 12:00 -1:00   Participation Level: Active   Behavioral Response: CasualAlertDepressed   Type of Therapy: Group therapy   Treatment Goals addressed: Coping   Progress Towards Goals: Progressing   Interventions: OT group   Summary: 12:00 - 12:50: Occupational Therapy group with cln E. Hollan.  12:50 - 1:00 Clinician assessed for immediate needs, medication compliance and efficacy, and safety concerns.  Therapist Response: 12:00 - 12:50: See note 12:50 - 1:00 pm: At check-out, patient reports no immediate concerns. Patient demonstrates progress as evidenced by continued engagement and responsiveness to treatment. Patient  denies SI/HI/self-harm thoughts at the end of group.    Suicidal/Homicidal: Nowithout intent/plan  Plan: Pt will continue in PHP while working to decrease mood and anxiety symptoms, increase daily functioning, and increase ability to manage symptoms in a healthy manner.   Collaboration of Care: Medication Management AEB J Cyndie Chime  Patient/Guardian was advised Release of Information must be obtained prior to any record release in order to collaborate their care with an outside provider. Patient/Guardian was advised if they have not already done so to contact the registration department to sign all necessary forms in order for [place] to release information regarding their care.   Consent: Patient/Guardian gives verbal consent for treatment and assignment of benefits for services provided during this visit. Patient/Guardian expressed understanding and agreed to proceed.   Diagnosis: Severe episode of recurrent major depressive disorder, without psychotic features (HCC) [F33.2]    1. Severe episode of recurrent major depressive disorder, without psychotic features (HCC)   2. GAD (generalized anxiety disorder)      Donia Guiles, LCSW

## 2023-04-23 NOTE — Psych (Signed)
Virtual Visit via Video Note  I connected with Alyssa Lutz on 04/20/23 at  9:00 AM EDT by a video enabled telemedicine application and verified that I am speaking with the correct person using two identifiers.  Location: Patient: patient home Provider: clinical home office   I discussed the limitations of evaluation and management by telemedicine and the availability of in person appointments. The patient expressed understanding and agreed to proceed.  I discussed the assessment and treatment plan with the patient. The patient was provided an opportunity to ask questions and all were answered. The patient agreed with the plan and demonstrated an understanding of the instructions.   The patient was advised to call back or seek an in-person evaluation if the symptoms worsen or if the condition fails to improve as anticipated.  Pt was provided 240 minutes of non-face-to-face time during this encounter.   Donia Guiles, LCSW   [facility] BH PHP THERAPIST PROGRESS NOTE  Alyssa Lutz [id number]  Session Time: 9:00 - 10:00  Participation Level: Active  Behavioral Response: CasualAlertDepressed  Type of Therapy: Group Therapy  Treatment Goals addressed: Coping  Progress Towards Goals: Progressing  Interventions: CBT, DBT, Supportive, and Reframing  Summary: Alyssa Lutz is a 42 y.o. female who presents with anxiety and mood symptoms. Clinician led check-in regarding current stressors and situation, and review of patient completed daily inventory. Clinician utilized active listening and empathetic response and validated patient emotions. Clinician facilitated processing group on pertinent issues.?    Therapist Response:  Patient arrived within time allowed. Patient rates her mood at a 5.5 on a scale of 1-10 with 10 being best. Pt states she feels "not great." Pt states she slept 6 hours and ate 2x. Pt reports she has been struggling with bad pain and depression days recently. Pt states  she overdid it on Saturday and it knocked her out for the next 3 days. Pt states feeling better after sleeping mainly for 2 days.  Patient able to process. Patient minimally engaged in discussion.            Session Time: 10:00 am - 11:00 am   Participation Level: Active   Behavioral Response: CasualAlertDepressed   Type of Therapy: Group Therapy   Treatment Goals addressed: Coping   Progress Towards Goals: Progressing   Interventions: CBT, DBT, Solution Focused, Strength-based, Supportive, and Reframing   Therapist Response: [name] led processing group for pt's current struggles. Group members shared stressors and provided support and feedback. [name] brought in topics of boundaries, healthy relationships, and unhealthy thought processes to inform discussion.    Therapist Response:  Pt able to process and provide support to group.           Session Time: 11:00 -12:00   Participation Level: Active   Behavioral Response: CasualAlertDepressed   Type of Therapy: Group Therapy   Treatment Goals addressed: Coping   Progress Towards Goals: Progressing   Interventions: Strength-based, Supportive, and Reframing   Summary: [name] group with [name]   Therapist Response: Pt participated and engaged in discussion.              Session Time: 12:00 -1:00   Participation Level: Active   Behavioral Response: CasualAlertDepressed   Type of Therapy: Group therapy   Treatment Goals addressed: Coping   Progress Towards Goals: Progressing   Interventions: OT group   Summary: 12:00 - 12:50: Occupational Therapy group with cln E. Hollan.  12:50 - 1:00 Clinician assessed for immediate needs, medication compliance  and efficacy, and safety concerns.   Therapist Response: 12:00 - 12:50: See note 12:50 - 1:00 pm: At check-out, patient reports no immediate concerns. Patient demonstrates progress as evidenced by continued engagement and responsiveness to treatment. Patient  denies SI/HI/self-harm thoughts at the end of group.    Suicidal/Homicidal: Nowithout intent/plan  Plan: Pt will continue in PHP while working to decrease mood and anxiety symptoms, increase daily functioning, and increase ability to manage symptoms in a healthy manner.   Collaboration of Care: Medication Management AEB J Cyndie Chime  Patient/Guardian was advised Release of Information must be obtained prior to any record release in order to collaborate their care with an outside provider. Patient/Guardian was advised if they have not already done so to contact the registration department to sign all necessary forms in order for [place] to release information regarding their care.   Consent: Patient/Guardian gives verbal consent for treatment and assignment of benefits for services provided during this visit. Patient/Guardian expressed understanding and agreed to proceed.   Diagnosis: Severe episode of recurrent major depressive disorder, without psychotic features (HCC) [F33.2]    1. Severe episode of recurrent major depressive disorder, without psychotic features (HCC)   2. Unspecified mood (affective) disorder (HCC)   3. GAD (generalized anxiety disorder)      Donia Guiles, LCSW

## 2023-04-23 NOTE — Psych (Signed)
Virtual Visit via Video Note  I connected with [name] on 04/11/23 at  9:00 AM EDT by a video enabled telemedicine application and verified that I am speaking with the correct person using two identifiers.  Location: Patient: patient home Provider: clinical home office   I discussed the limitations of evaluation and management by telemedicine and the availability of in person appointments. The patient expressed understanding and agreed to proceed.  I discussed the assessment and treatment plan with the patient. The patient was provided an opportunity to ask questions and all were answered. The patient agreed with the plan and demonstrated an understanding of the instructions.   The patient was advised to call back or seek an in-person evaluation if the symptoms worsen or if the condition fails to improve as anticipated.  Pt was provided 240 minutes of non-face-to-face time during this encounter.   Donia Guiles, LCSW   [facility] PHP THERAPIST PROGRESS NOTE  TANEESHA NEIGER [id number]  Session Time: 9:00 - 10:00  Participation Level: Active  Behavioral Response: CasualAlertDepressed  Type of Therapy: Group Therapy  Treatment Goals addressed: Coping  Progress Towards Goals: Progressing  Interventions: CBT, DBT, Supportive, and Reframing  Summary: NAKETA BUDNICK is a 42 y.o. female who presents with anxiety and mood symptoms. Clinician led check-in regarding current stressors and situation, and review of patient completed daily inventory. Clinician utilized active listening and empathetic response and validated patient emotions. Clinician facilitated processing group on pertinent issues.?    Therapist Response:  Patient arrived within time allowed. Patient rates her mood at a 4.5 on a scale of 1-10 with 10 being best. Pt states she feels "tired." Pt states she slept 4 hours and ate 3x. Pt reports she slept well on Fri/Sat nights getting 9-10 hours, however last night she woke  up at 3am ruminating and rabbit holing about monkey pox and medication side effects. Pt reports high anxiety, fear, and crying. Pt states the weekend was mostly good and she spent time with her son and saw her parents/sister. Pt shares feeling she has to mask around most people in her life. Patient able to process. Patient minimally engaged in discussion.            Session Time: 10:00 am - 11:00 am   Participation Level: Active   Behavioral Response: CasualAlertDepressed   Type of Therapy: Group Therapy   Treatment Goals addressed: Coping   Progress Towards Goals: Progressing   Interventions: CBT, DBT, Solution Focused, Strength-based, Supportive, and Reframing   Therapist Response: [name] led processing group for pt's current struggles. Group members shared stressors and provided support and feedback. [name] brought in topics of boundaries, healthy relationships, and unhealthy thought processes to inform discussion.    Therapist Response:  Pt able to process and provide support to group.           Session Time: 11:00 -12:00   Participation Level: Active   Behavioral Response: CasualAlertDepressed   Type of Therapy: Group Therapy   Treatment Goals addressed: Coping   Progress Towards Goals: Progressing   Interventions: CBT, DBT, Solution Focused, Strength-based, Supportive, and Reframing   Summary: [name] introduced topic of stress management and the model of the "4 A's of stress management:" avoid, alter, accept, and adapt. Group members worked through Engineer, structural and discussed barriers to utilizing the 4 A's for stressors.    Therapist Response: Pt engaged in discussion and reports understanding of how to utilize the 4 A's.  Session Time: 12:00 -1:00   Participation Level: Active   Behavioral Response: CasualAlertDepressed   Type of Therapy: Group therapy   Treatment Goals addressed: Coping   Progress Towards Goals: Progressing   Interventions: OT group    Summary: 12:00 - 12:50: Occupational Therapy group with cln E. Hollan.  12:50 - 1:00 Clinician assessed for immediate needs, medication compliance and efficacy, and safety concerns.   Therapist Response: 12:00 - 12:50: See note 12:50 - 1:00 pm: At check-out, patient reports no immediate concerns. Patient demonstrates progress as evidenced by continued engagement and responsiveness to treatment. Patient denies SI/HI/self-harm thoughts at the end of group.  Suicidal/Homicidal: Nowithout intent/plan  Plan: Pt will continue in PHP while working to decrease mood and anxiety symptoms, increase daily functioning, and increase ability to manage symptoms in a healthy manner.   Collaboration of Care: Medication Management AEB J Cyndie Chime  Patient/Guardian was advised Release of Information must be obtained prior to any record release in order to collaborate their care with an outside provider. Patient/Guardian was advised if they have not already done so to contact the registration department to sign all necessary forms in order for [place] to release information regarding their care.   Consent: Patient/Guardian gives verbal consent for treatment and assignment of benefits for services provided during this visit. Patient/Guardian expressed understanding and agreed to proceed.   Diagnosis: Severe episode of recurrent major depressive disorder, without psychotic features (HCC) [F33.2]    1. Severe episode of recurrent major depressive disorder, without psychotic features (HCC)   2. GAD (generalized anxiety disorder)      Donia Guiles, LCSW

## 2023-04-25 ENCOUNTER — Other Ambulatory Visit (HOSPITAL_COMMUNITY): Payer: PRIVATE HEALTH INSURANCE

## 2023-04-26 ENCOUNTER — Ambulatory Visit (HOSPITAL_COMMUNITY): Payer: PRIVATE HEALTH INSURANCE

## 2023-04-26 ENCOUNTER — Other Ambulatory Visit (HOSPITAL_COMMUNITY): Payer: PRIVATE HEALTH INSURANCE

## 2023-04-26 DIAGNOSIS — Z79899 Other long term (current) drug therapy: Secondary | ICD-10-CM | POA: Insufficient documentation

## 2023-04-26 DIAGNOSIS — Z760 Encounter for issue of repeat prescription: Secondary | ICD-10-CM | POA: Insufficient documentation

## 2023-04-26 DIAGNOSIS — E0500 Thyrotoxicosis with diffuse goiter without thyrotoxic crisis or storm: Secondary | ICD-10-CM | POA: Insufficient documentation

## 2023-04-26 DIAGNOSIS — R4689 Other symptoms and signs involving appearance and behavior: Secondary | ICD-10-CM | POA: Insufficient documentation

## 2023-04-26 DIAGNOSIS — F332 Major depressive disorder, recurrent severe without psychotic features: Secondary | ICD-10-CM | POA: Insufficient documentation

## 2023-04-26 DIAGNOSIS — F411 Generalized anxiety disorder: Secondary | ICD-10-CM | POA: Insufficient documentation

## 2023-04-26 DIAGNOSIS — M818 Other osteoporosis without current pathological fracture: Secondary | ICD-10-CM | POA: Insufficient documentation

## 2023-04-26 DIAGNOSIS — Z9151 Personal history of suicidal behavior: Secondary | ICD-10-CM | POA: Insufficient documentation

## 2023-04-26 DIAGNOSIS — F39 Unspecified mood [affective] disorder: Secondary | ICD-10-CM | POA: Insufficient documentation

## 2023-04-27 ENCOUNTER — Other Ambulatory Visit (HOSPITAL_COMMUNITY): Payer: PRIVATE HEALTH INSURANCE

## 2023-04-27 ENCOUNTER — Other Ambulatory Visit (HOSPITAL_COMMUNITY): Payer: PRIVATE HEALTH INSURANCE | Attending: Psychiatry | Admitting: Licensed Clinical Social Worker

## 2023-04-27 ENCOUNTER — Encounter (HOSPITAL_COMMUNITY): Payer: Self-pay

## 2023-04-27 DIAGNOSIS — F39 Unspecified mood [affective] disorder: Secondary | ICD-10-CM | POA: Diagnosis not present

## 2023-04-27 DIAGNOSIS — F332 Major depressive disorder, recurrent severe without psychotic features: Secondary | ICD-10-CM

## 2023-04-27 DIAGNOSIS — M818 Other osteoporosis without current pathological fracture: Secondary | ICD-10-CM | POA: Diagnosis not present

## 2023-04-27 DIAGNOSIS — F411 Generalized anxiety disorder: Secondary | ICD-10-CM | POA: Diagnosis not present

## 2023-04-27 DIAGNOSIS — E0500 Thyrotoxicosis with diffuse goiter without thyrotoxic crisis or storm: Secondary | ICD-10-CM | POA: Diagnosis not present

## 2023-04-27 DIAGNOSIS — R4689 Other symptoms and signs involving appearance and behavior: Secondary | ICD-10-CM | POA: Diagnosis not present

## 2023-04-27 DIAGNOSIS — F419 Anxiety disorder, unspecified: Secondary | ICD-10-CM | POA: Diagnosis present

## 2023-04-27 DIAGNOSIS — R4589 Other symptoms and signs involving emotional state: Secondary | ICD-10-CM

## 2023-04-27 DIAGNOSIS — Z9151 Personal history of suicidal behavior: Secondary | ICD-10-CM | POA: Diagnosis not present

## 2023-04-27 DIAGNOSIS — Z79899 Other long term (current) drug therapy: Secondary | ICD-10-CM | POA: Diagnosis not present

## 2023-04-27 DIAGNOSIS — Z760 Encounter for issue of repeat prescription: Secondary | ICD-10-CM | POA: Diagnosis not present

## 2023-04-27 NOTE — Progress Notes (Signed)
Spoke with patient via Teams video call, used 2 identifiers to correctly identify patient. States that groups are going well. She is going back to work next week and will attend regular therapy sessions. On scale 1-10 as 10 being worst she rates depression at 6/7 and anxiety at 6/7. Denies SI/HI or AV hallucinations. PHQ9=10. No side effects from medications. Pleasant, cooperative with appropriate affect.

## 2023-04-27 NOTE — Therapy (Signed)
[facility] PARTIAL HOSPITALIZATION PROGRAM [address] [place], [place], [address] Phone: [phone]   Fax:  [phone]  Occupational Therapy Treatment Virtual Visit via Video Note  I connected with Alyssa Lutz on 04/27/23 at  8:00 AM EDT by a video enabled telemedicine application and verified that I am speaking with the correct person using two identifiers.  Location: Patient: home Provider: office   I discussed the limitations of evaluation and management by telemedicine and the availability of in person appointments. The patient expressed understanding and agreed to proceed.    The patient was advised to call back or seek an in-person evaluation if the symptoms worsen or if the condition fails to improve as anticipated.  I provided 55 minutes of non-face-to-face time during this encounter.   Patient Details  Name: Alyssa Lutz MRN: [id number] Date of Birth: [birth year] No data recorded  Encounter Date: 04/22/2023   OT End of Session - 04/27/23 0905     Visit Number 10    Number of Visits 20    Date for OT Re-Evaluation 05/06/23    OT Start Time 1200    OT Stop Time 1255    OT Time Calculation (min) 55 min             Past Medical History:  Diagnosis Date   ADHD    Anxiety    Depression    Graves disease    History of spinal fracture     Past Surgical History:  Procedure Laterality Date   APPENDECTOMY     CESAREAN SECTION      There were no vitals filed for this visit.   Subjective Assessment - 04/27/23 0905     Currently in Pain? No/denies    Pain Score 0-No pain                Group Session:  S: Doing better today.   O: The primary objective of this group therapy session is to equip participants with practical strategies and coping mechanisms to effectively manage accumulated tasks that seem insurmountable due to mental health challenges such as depression and anxiety. The group aims to build a  supportive environment wherein individuals can openly discuss their struggles, share experiences, and learn from one another. The session's strategies will encompass goal setting, time management, task breakdown, creating conducive environments, and mindfulness practices. We aim to help participants perceive tasks as manageable units rather than overwhelming piles and encourage an approach of progress over perfection.   A: Patient demonstrated active engagement throughout the session. They contributed valuable insights during discussions, asked relevant questions, and showed enthusiasm towards learning new strategies. Their interaction with other group members was respectful and empathetic, contributing to a supportive group dynamic. Patient appeared to resonate with the strategies of "breaking it down" and "creating a conducive environment," and they shared plans to incorporate these strategies into their daily routine. Their active participation, willingness to share personal experiences, and acceptance of new strategies demonstrate a strong benefit from this therapy session.    P: Continue to attend PHP OT group sessions 5x week for 4 weeks to promote daily structure, social engagement, and opportunities to develop and utilize adaptive strategies to maximize functional performance in preparation for safe transition and integration back into school, work, and the community. Plan to address topic of tbd in next OT group session.  OT Education - 04/27/23 0905     Education Details Overcoming Task Accumulation              OT Short Term Goals - 04/06/23 0840       OT SHORT TERM GOAL #1   Title Client will develop and utilize a personalized coping toolbox containing at least five coping strategies to manage challenging situations, demonstrating their use in real-life scenarios by the end of therapy.    Time 4    Period Weeks    Status On-going    Target Date  05/06/23      OT SHORT TERM GOAL #2   Title By discharge, client will demonstrate the ability to adapt and modify routines when faced with unexpected events or changes, maintaining a balanced approach to daily tasks.    Status On-going      OT SHORT TERM GOAL #3   Title By the time of discharge, client will independently set, track, and make progress towards a long-term goal, demonstrating resilience in overcoming obstacles and seeking support when needed.    Status On-going                      Plan - 04/27/23 0905     Psychosocial Skills Interpersonal Interaction;Routines and Behaviors;Habits;Coping Strategies             Patient will benefit from skilled therapeutic intervention in order to improve the following deficits and impairments:       Psychosocial Skills: Interpersonal Interaction, Routines and Behaviors, Habits, Coping Strategies   Visit Diagnosis: Difficulty coping    Problem List Patient Active Problem List   Diagnosis Date Noted   Graves disease 04/06/2023   Unspecified mood (affective) disorder (HCC) 04/04/2023   MDD (major depressive disorder), recurrent episode, severe (HCC) 03/30/2023   GAD (generalized anxiety disorder) 03/30/2023    Ted Mcalpine, OT 04/27/2023, 9:07 AM  Kerrin Champagne, OT   [facility] HOSPITALIZATION PROGRAM [address] [place], [place], [address] Phone: [phone]   Fax:  [phone]  Name: Alyssa Lutz MRN: [id number] Date of Birth: 29-Aug-1980

## 2023-04-28 ENCOUNTER — Other Ambulatory Visit (HOSPITAL_COMMUNITY): Payer: PRIVATE HEALTH INSURANCE

## 2023-04-29 ENCOUNTER — Other Ambulatory Visit (HOSPITAL_BASED_OUTPATIENT_CLINIC_OR_DEPARTMENT_OTHER): Payer: PRIVATE HEALTH INSURANCE | Admitting: Licensed Clinical Social Worker

## 2023-04-29 ENCOUNTER — Other Ambulatory Visit (HOSPITAL_COMMUNITY): Payer: PRIVATE HEALTH INSURANCE

## 2023-04-29 ENCOUNTER — Encounter (HOSPITAL_COMMUNITY): Payer: Self-pay

## 2023-04-29 DIAGNOSIS — F39 Unspecified mood [affective] disorder: Secondary | ICD-10-CM

## 2023-04-29 DIAGNOSIS — F411 Generalized anxiety disorder: Secondary | ICD-10-CM

## 2023-04-29 DIAGNOSIS — F332 Major depressive disorder, recurrent severe without psychotic features: Secondary | ICD-10-CM

## 2023-04-29 MED ORDER — QUETIAPINE FUMARATE 25 MG PO TABS: 25.0000 mg | ORAL_TABLET | Freq: Every day | ORAL | 0 refills | Status: AC

## 2023-04-29 MED ORDER — OXCARBAZEPINE 150 MG PO TABS: 150.0000 mg | ORAL_TABLET | Freq: Every day | ORAL | 0 refills | Status: AC

## 2023-04-29 NOTE — Therapy (Signed)
[facility] PARTIAL HOSPITALIZATION PROGRAM [address] [place], [place], [address] Phone: [phone]   Fax:  [phone]  Occupational Therapy Treatment Virtual Visit via Video Note  I connected with Alyssa Lutz on 04/29/23 at  8:00 AM EDT by a video enabled telemedicine application and verified that I am speaking with the correct person using two identifiers.  Location: Patient: home Provider: office   I discussed the limitations of evaluation and management by telemedicine and the availability of in person appointments. The patient expressed understanding and agreed to proceed.    The patient was advised to call back or seek an in-person evaluation if the symptoms worsen or if the condition fails to improve as anticipated.  I provided 55 minutes of non-face-to-face time during this encounter.   Patient Details  Name: Alyssa Lutz MRN: [id number] Date of Birth: Mar 18, 1981 No data recorded  Encounter Date: 04/27/2023   OT End of Session - 04/29/23 0915     Visit Number 11    Number of Visits 20    Date for OT Re-Evaluation 05/06/23    OT Start Time 1200    OT Stop Time 1255    OT Time Calculation (min) 55 min             Past Medical History:  Diagnosis Date   ADHD    Anxiety    Depression    Graves disease    History of spinal fracture     Past Surgical History:  Procedure Laterality Date   APPENDECTOMY     CESAREAN SECTION      There were no vitals filed for this visit.   Subjective Assessment - 04/29/23 0915     Currently in Pain? No/denies    Pain Score 0-No pain                Group Session:  S: Doing better today.   O: Today's OT group session focused on reframing daily care tasks as "morally neutral" in order to alleviate feelings of guilt or shame often associated with unmet responsibilities. The goal of this session was to help patients recognize that tasks such as cleaning, laundry, and self-care  are not reflections of personal worth but are simply functional activities that can be approached in smaller, manageable steps. Strategies such as micro-goals, compassionate reframing, and the 5-minute rule were introduced to help patients develop a more balanced and self-compassionate approach to managing these tasks.   A: The patient actively participated in the group discussion, demonstrating insight into the concept of morally neutral tasks. They shared personal experiences of feeling overwhelmed by daily responsibilities and reflected on how this perspective could alleviate some of the pressure they face. The patient verbalized an understanding of breaking tasks into smaller steps and expressed willingness to try specific strategies discussed in the session, such as using the 5-minute rule and focusing on functionality over perfection.   P: Continue to attend PHP OT group sessions 5x week for 4 weeks to promote daily structure, social engagement, and opportunities to develop and utilize adaptive strategies to maximize functional performance in preparation for safe transition and integration back into school, work, and the community. Plan to address topic of tbd in next OT group session.                   OT Education - 04/29/23 0915     Education Details Reframing Care Tasks  OT Short Term Goals - 04/06/23 0840       OT SHORT TERM GOAL #1   Title Client will develop and utilize a personalized coping toolbox containing at least five coping strategies to manage challenging situations, demonstrating their use in real-life scenarios by the end of therapy.    Time 4    Period Weeks    Status On-going    Target Date 05/06/23      OT SHORT TERM GOAL #2   Title By discharge, client will demonstrate the ability to adapt and modify routines when faced with unexpected events or changes, maintaining a balanced approach to daily tasks.    Status On-going      OT  SHORT TERM GOAL #3   Title By the time of discharge, client will independently set, track, and make progress towards a long-term goal, demonstrating resilience in overcoming obstacles and seeking support when needed.    Status On-going                      Plan - 04/29/23 0916     Psychosocial Skills Interpersonal Interaction;Routines and Behaviors;Habits;Coping Strategies             Patient will benefit from skilled therapeutic intervention in order to improve the following deficits and impairments:       Psychosocial Skills: Interpersonal Interaction, Routines and Behaviors, Habits, Coping Strategies   Visit Diagnosis: Difficulty coping    Problem List Patient Active Problem List   Diagnosis Date Noted   Graves disease 04/06/2023   Unspecified mood (affective) disorder (HCC) 04/04/2023   MDD (major depressive disorder), recurrent episode, severe (HCC) 03/30/2023   GAD (generalized anxiety disorder) 03/30/2023    Ted Mcalpine, OT 04/29/2023, 9:16 AM  Kerrin Champagne, OT   [facility] HOSPITALIZATION PROGRAM [address] [place], [place], [address] Phone: [phone]   Fax:  [phone]  Name: Alyssa Lutz MRN: [id number] Date of Birth: 23-Jun-1981

## 2023-04-29 NOTE — Progress Notes (Signed)
Virtual Visit via Video Note  I connected with Alyssa Lutz on 04/30/23 at  9:00 AM EDT by a video enabled telemedicine application and verified that I am speaking with the correct person using two identifiers.  Location: Patient: Office Provider: Home   I discussed the limitations of evaluation and management by telemedicine and the availability of in person appointments. The patient expressed understanding and agreed to proceed.     I discussed the assessment and treatment plan with the patient. The patient was provided an opportunity to ask questions and all were answered. The patient agreed with the plan and demonstrated an understanding of the instructions.   The patient was advised to call back or seek an in-person evaluation if the symptoms worsen or if the condition fails to improve as anticipated.  I provided 15 minutes of non-face-to-face time during this encounter.   Alyssa Rack, NP   [facility]  Partial Hospitalization Outpatient Program Discharge Summary  Alyssa Lutz [id number]  Admission date: 04/04/2023 Discharge date: 04/29/2023  Reason for admission: per admission assessment note:  "Alyssa Lutz is a 42 y.o. female w/ PPH of bipolar 2 d/o vs MDD and anxiety and PMH of Graves Dx and lactation induced-osteoporosis. Patient has 1 prior SA with BH hospitalization."    Progress in Program Toward Treatment Goals: Initial-she attended and participated with daily group session with active and engaged participation.  Reports plans to pay better attention to her physical health and putting herself first.  States she knows that she needs to ask for help when things become overwhelming.   Progress (rationale): patient was seen and evaluated via virtual video assessment.  She is denying suicidal homicidal ideations.  Denies auditory visual hallucinations.  During partial hospitalization programming patient was initiated on Trileptal 150 mg at bedtime.  She  reports she has been taking and tolerating well.  Patient has requested medication refill with plans to follow-up with outpatient providers.  Patient has received a list of referrals for therapy and psychiatry services at discharge.  Collaboration of Care: Medication Management AEB Trileptal 150 mg daily and Seroquel 25 mg nightly   Patient/Guardian was advised Release of Information must be obtained prior to any record release in order to collaborate their care with an outside provider. Patient/Guardian was advised if they have not already done so to contact the registration department to sign all necessary forms in order for [place] to release information regarding their care.   Consent: Patient/Guardian gives verbal consent for treatment and assignment of benefits for services provided during this visit. Patient/Guardian expressed understanding and agreed to proceed.   Alyssa Jacks NP   04/30/2023

## 2023-04-30 ENCOUNTER — Encounter (HOSPITAL_COMMUNITY): Payer: Self-pay | Admitting: Family

## 2023-05-13 ENCOUNTER — Other Ambulatory Visit (HOSPITAL_COMMUNITY): Payer: Self-pay | Admitting: Family

## 2023-05-13 DIAGNOSIS — F39 Unspecified mood [affective] disorder: Secondary | ICD-10-CM

## 2023-05-21 NOTE — Psych (Signed)
Virtual Visit via Video Note  I connected with Alyssa Lutz on 04/27/23 at  9:00 AM EDT by a video enabled telemedicine application and verified that I am speaking with the correct person using two identifiers.  Location: Patient: patient home Provider: clinical home office   I discussed the limitations of evaluation and management by telemedicine and the availability of in person appointments. The patient expressed understanding and agreed to proceed.  I discussed the assessment and treatment plan with the patient. The patient was provided an opportunity to ask questions and all were answered. The patient agreed with the plan and demonstrated an understanding of the instructions.   The patient was advised to call back or seek an in-person evaluation if the symptoms worsen or if the condition fails to improve as anticipated.  Pt was provided 240 minutes of non-face-to-face time during this encounter.   Donia Guiles, LCSW   [facility] PHP THERAPIST PROGRESS NOTE  Alyssa Lutz [id number]  Session Time: 9:00 - 10:00  Participation Level: Active  Behavioral Response: CasualAlertDepressed  Type of Therapy: Group Therapy  Treatment Goals addressed: Coping  Progress Towards Goals: Progressing  Interventions: CBT, DBT, Supportive, and Reframing  Summary: Alyssa Lutz is a 42 y.o. female who presents with anxiety and mood symptoms. Clinician led check-in regarding current stressors and situation, and review of patient completed daily inventory. Clinician utilized active listening and empathetic response and validated patient emotions. Clinician facilitated processing group on pertinent issues.?    Therapist Response:  Patient arrived within time allowed. Patient rates her mood at a 6.5 on a scale of 1-10 with 10 being best. Pt states she feels "okay." Pt states she slept 9 hours and ate 2x. Pt reports her husband and son have both been ill since the weekend. Pt states she has been as  well but not to as severe a degree. Pt states sleeping a lot since the weekend due to feeling ill. Pt shares she began new medication on Friday and hs yet to note any negative side effects. Pt reports feeling excited about a new tv show this morning and "it's been a while since I felt excited about something." Patient able to process. Patient engaged in discussion.            Session Time: 10:00 am - 11:00 am   Participation Level: Active   Behavioral Response: CasualAlertDepressed   Type of Therapy: Group Therapy   Treatment Goals addressed: Coping   Progress Towards Goals: Progressing   Interventions: CBT, DBT, Solution Focused, Strength-based, Supportive, and Reframing   Therapist Response: [name] facilitated processing group around difficult relationships. Group members shared struggles they face in relationships. [name] brought in topics of self-esteem, CBT thought challenging, boundaries, and communication to aid growth.   Therapist Response: Pt engaged in discussion and is able to process.          Session Time: 11:00 -12:00   Participation Level: Active   Behavioral Response: CasualAlertDepressed   Type of Therapy: Group Therapy   Treatment Goals addressed: Coping   Progress Towards Goals: Progressing   Interventions: CBT, DBT, Solution Focused, Strength-based, Supportive, and Reframing   Summary: [name] led discussion on "why" and "what now" in terms of how we focus on our problems. Group members shared how focus on "why" has impacted them and barriers to focusing on "what now." Group able to process. [name] encouraged pt's to consider what "why" accomplishes.    Therapist Response:  Pt engaged in discussion  and is able to gain insight.       Session Time: 12:00 -1:00   Participation Level: Active   Behavioral Response: CasualAlertDepressed   Type of Therapy: Group therapy   Treatment Goals addressed: Coping   Progress Towards Goals: Progressing   Interventions:  OT group   Summary: 12:00 - 12:50: Occupational Therapy group with cln E. Hollan.  12:50 - 1:00 Clinician assessed for immediate needs, medication compliance and efficacy, and safety concerns.   Therapist Response: 12:00 - 12:50: See note 12:50 - 1:00 pm: At check-out, patient reports no immediate concerns. Patient demonstrates progress as evidenced by continued engagement and responsiveness to treatment. Patient denies SI/HI/self-harm thoughts at the end of group.    Suicidal/Homicidal: Nowithout intent/plan  Plan: Pt will continue in PHP while working to decrease mood and anxiety symptoms, increase daily functioning, and increase ability to manage symptoms in a healthy manner.   Collaboration of Care: Medication Management AEB J Cyndie Chime  Patient/Guardian was advised Release of Information must be obtained prior to any record release in order to collaborate their care with an outside provider. Patient/Guardian was advised if they have not already done so to contact the registration department to sign all necessary forms in order for [place] to release information regarding their care.   Consent: Patient/Guardian gives verbal consent for treatment and assignment of benefits for services provided during this visit. Patient/Guardian expressed understanding and agreed to proceed.   Diagnosis: Severe episode of recurrent major depressive disorder, without psychotic features (HCC) [F33.2]    1. Severe episode of recurrent major depressive disorder, without psychotic features (HCC)   2. GAD (generalized anxiety disorder)      Donia Guiles, LCSW

## 2023-05-22 NOTE — Psych (Signed)
Virtual Visit via Video Note  I connected with Alyssa Lutz on 04/29/23 at  9:00 AM EDT by a video enabled telemedicine application and verified that I am speaking with the correct person using two identifiers.  Location: Patient: patient home Provider: clinical home office   I discussed the limitations of evaluation and management by telemedicine and the availability of in person appointments. The patient expressed understanding and agreed to proceed.  I discussed the assessment and treatment plan with the patient. The patient was provided an opportunity to ask questions and all were answered. The patient agreed with the plan and demonstrated an understanding of the instructions.   The patient was advised to call back or seek an in-person evaluation if the symptoms worsen or if the condition fails to improve as anticipated.  Pt was provided 120 minutes of non-face-to-face time during this encounter.   Donia Guiles, LCSW   [facility] PHP THERAPIST PROGRESS NOTE  SHANDORA KOOGLER [id number]  Session Time: 9:00 - 10:00  Participation Level: Active  Behavioral Response: CasualAlertDepressed  Type of Therapy: Group Therapy  Treatment Goals addressed: Coping  Progress Towards Goals: Progressing  Interventions: CBT, DBT, Supportive, and Reframing  Summary: Alyssa Lutz is a 42 y.o. female who presents with anxiety and mood symptoms. Clinician led check-in regarding current stressors and situation, and review of patient completed daily inventory. Clinician utilized active listening and empathetic response and validated patient emotions. Clinician facilitated processing group on pertinent issues.?    Therapist Response:  Patient arrived within time allowed. Patient rates her mood at a 7 on a scale of 1-10 with 10 being best. Pt states she feels "okay." Pt states she slept 6 hours and ate 3x. Pt reports she had a "rest day" yesterday and read, slept, and did Lego. Pt reports high anxiety  re: returning to work next week due to feeling embarrassed about how she left. Pt accepts challenging and support from group. Pt able to determine ways she can plan ahead to decrease anxiety before clocking in to work.  Patient able to process. Patient engaged in discussion.            Session Time: 10:00 am - 11:00 am   Participation Level: Active   Behavioral Response: CasualAlertDepressed   Type of Therapy: Group Therapy   Treatment Goals addressed: Coping   Progress Towards Goals: Progressing   Interventions: CBT, DBT, Solution Focused, Strength-based, Supportive, and Reframing   Therapist Response: [name] led discussion on ways to manage stressors and feelings over the weekend. Group members  brainstormed things to do over the weekend for multiple levels of energy, access, and moods. [name] reviewed crisis services should they be needed and provided pt's with the text crisis line, mobile crisis, national suicide hotline, [facility] 24/7 line, and information on [facility].      Therapist Response:  Pt engaged in discussion and is able to identify 3 ideas of what to do over the weekend to keep their mind engaged.   **Pt chose to leave group at 11 due to conflicting appointment.     Suicidal/Homicidal: Nowithout intent/plan  Plan: Pt will discharge from PHP due to meeting treatment goals of decreased mood and anxiety symptoms, increased daily functioning, and increased ability to manage symptoms in a healthy manner. Pt has declined IOP due to need to return to work. Pt will step down to outpatient therapy and psychiatry. Pt and provider are aligned with discharge plan. Pt denies SI/HI at time  of discharge.    Collaboration of Care: Medication Management AEB J Cyndie Chime  Patient/Guardian was advised Release of Information must be obtained prior to any record release in order to collaborate their care with an outside provider. Patient/Guardian was advised if they have not already done so to  contact the registration department to sign all necessary forms in order for [place] to release information regarding their care.   Consent: Patient/Guardian gives verbal consent for treatment and assignment of benefits for services provided during this visit. Patient/Guardian expressed understanding and agreed to proceed.   Diagnosis: Severe episode of recurrent major depressive disorder, without psychotic features (HCC) [F33.2]    1. Severe episode of recurrent major depressive disorder, without psychotic features (HCC)   2. Unspecified mood (affective) disorder (HCC)   3. GAD (generalized anxiety disorder)      Donia Guiles, LCSW

## 2024-02-03 NOTE — Progress Notes (Signed)
 Thyroid levels are close to normal but would suggest to take the methimazole  every day instead of every other day and to recheck labs in about 6 weeks.  Let us  know if you need refills on this.    Vitamin D is lower than our goal for bone health.  Please increase the supplement dose and have this rechecked as well.

## 2024-09-26 ENCOUNTER — Other Ambulatory Visit: Payer: Self-pay

## 2024-09-26 ENCOUNTER — Ambulatory Visit
Admission: EM | Admit: 2024-09-26 | Discharge: 2024-09-26 | Disposition: A | Discharge status: Home / Self Care | Payer: PRIVATE HEALTH INSURANCE | Source: Home / Self Care

## 2024-09-26 ENCOUNTER — Encounter: Payer: Self-pay | Admitting: Emergency Medicine

## 2024-09-26 DIAGNOSIS — S61101A Unspecified open wound of right thumb with damage to nail, initial encounter: Secondary | ICD-10-CM | POA: Diagnosis not present

## 2024-09-26 NOTE — ED Provider Notes (Signed)
 "   [facility]    CSN: [id number] Arrival date & time: 09/26/24  0806      History   Chief Complaint Chief Complaint  Patient presents with   Nail Problem    HPI Alyssa Lutz is a 44 y.o. female.   Alyssa Lutz is a 44 y.o. female presenting for chief complaint of Nail Problem.  She got her thumbnail caught on something 3 to 4 days ago and it has been slowly tearing even more/getting caught on other things over the last 3 days causing significant pain.  Right thumb nail is still attached at the lateral nailbed.  She has not seen any pus, drainage, redness, or swelling to the area in the last 3 days.  No bleeding.  Reports significant and intense pain to the right thumbnail.      Past Medical History:  Diagnosis Date   ADHD    Anxiety    Depression    Graves disease    History of spinal fracture     Patient Active Problem List   Diagnosis Date Noted   Graves disease 04/06/2023   Unspecified mood (affective) disorder 04/04/2023   MDD (major depressive disorder), recurrent episode, severe (HCC) 03/30/2023   GAD (generalized anxiety disorder) 03/30/2023    Past Surgical History:  Procedure Laterality Date   APPENDECTOMY     CESAREAN SECTION      OB History   No obstetric history on file.      Home Medications    Prior to Admission medications  Medication Sig Start Date End Date Taking? Authorizing Provider  amphetamine-dextroamphetamine (ADDERALL) 20 MG tablet Take 20 mg by mouth. 05/30/18  Yes [provider]  BUTLER 0.4-35 MG-MCG tablet Take 1 tablet by mouth daily.    [provider]  benzonatate  (TESSALON ) 100 MG capsule Take 1 capsule (100 mg total) by mouth 3 (three) times daily as needed for cough. Do not take with alcohol or while driving or operating heavy machinery. May cause drowsiness. Patient not taking: Reported on 04/13/2023 10/04/22   Chandra Raisin A, NP  calcium citrate (CALCITRATE - DOSED IN MG ELEMENTAL  CALCIUM) 950 (200 Ca) MG tablet Take by mouth.    [provider]  Cholecalciferol (D 1000) 25 MCG (1000 UT) capsule Take by mouth.    [provider]  diazepam  (VALIUM ) 2 MG tablet Take 1 tablet (2 mg total) by mouth daily as needed. Patient not taking: Reported on 04/21/2023 03/29/23   Mardy Elveria DEL, NP  fluticasone  (FLONASE ) 50 MCG/ACT nasal spray Place 1 spray into both nostrils 2 (two) times daily. 10/11/22   Stuart Vernell Norris, PA-C  methimazole  (TAPAZOLE ) 5 MG tablet Take 1 tablet (5 mg total) by mouth daily. 03/29/23   Mardy Elveria DEL, NP  Multiple Vitamin (MULTIVITAMIN) capsule multivitamin    [provider]  OXcarbazepine  (TRILEPTAL ) 150 MG tablet Take 1 tablet (150 mg total) by mouth at bedtime. 04/29/23 05/29/23  Ezzard Staci SAILOR, NP  QUEtiapine  (SEROQUEL ) 25 MG tablet Take 1-2 tablets (25-50 mg total) by mouth at bedtime. 04/29/23 05/29/23  Ezzard Staci SAILOR, NP  Vitamin D, Ergocalciferol, (DRISDOL) 1.25 MG (50000 UNIT) CAPS capsule Take by mouth. 06/17/22   [provider]    Family History Family History  Problem Relation Age of Onset   Bipolar disorder Sister    Bipolar disorder Brother    Personality disorder Cousin     Social History Social History[1]   Allergies  Patient has no known allergies.   Review of Systems Review of Systems Per HPI  Physical Exam Triage Vital Signs ED Triage Vitals  Encounter Vitals Group     BP 09/26/24 0819 106/72     Girls Systolic BP Percentile --      Girls Diastolic BP Percentile --      Boys Systolic BP Percentile --      Boys Diastolic BP Percentile --      Pulse Rate 09/26/24 0819 90     Resp 09/26/24 0819 20     Temp 09/26/24 0819 98.2 F (36.8 C)     Temp Source 09/26/24 0819 Oral     SpO2 09/26/24 0819 96 %     Weight --      Height --      Head Circumference --      Peak Flow --      Pain Score 09/26/24 0816 10     Pain Loc --      Pain Education --      Exclude from  Growth Chart --    No data found.  Updated Vital Signs BP 106/72 (BP Location: Right Arm)   Pulse 90   Temp 98.2 F (36.8 C) (Oral)   Resp 20   LMP 09/16/2024 (Approximate)   SpO2 96%   Visual Acuity Right Eye Distance:   Left Eye Distance:   Bilateral Distance:    Right Eye Near:   Left Eye Near:    Bilateral Near:     Physical Exam Vitals and nursing note reviewed.  Constitutional:      Appearance: She is not ill-appearing or toxic-appearing.  HENT:     Head: Normocephalic and atraumatic.     Right Ear: Hearing and external ear normal.     Left Ear: Hearing and external ear normal.     Nose: Nose normal.     Mouth/Throat:     Lips: Pink.  Eyes:     General: Lids are normal. Vision grossly intact. Gaze aligned appropriately.     Extraocular Movements: Extraocular movements intact.     Conjunctiva/sclera: Conjunctivae normal.  Pulmonary:     Effort: Pulmonary effort is normal.  Musculoskeletal:     Cervical back: Neck supple.     Comments: Right thumb: Partial nail avulsion of right thumbnail with nail attached to the distal right lateral nail fold.  No drainage, swelling, warmth, erythema.  Less than 2 cap refill.  +2 right radial pulse.  Sensation intact to distal right thumb, strength intact to distal right thumb.  Skin:    General: Skin is warm and dry.     Capillary Refill: Capillary refill takes less than 2 seconds.     Findings: No rash.  Neurological:     General: No focal deficit present.     Mental Status: She is alert and oriented to person, place, and time. Mental status is at baseline.     Cranial Nerves: No dysarthria or facial asymmetry.  Psychiatric:        Mood and Affect: Mood normal.        Speech: Speech normal.        Behavior: Behavior normal.        Thought Content: Thought content normal.        Judgment: Judgment normal.    Removed portion of nail   Image of right thumbnail after procedure    UC Treatments / Results   Labs (all labs ordered are  listed, but only abnormal results are displayed) Labs Reviewed - No data to display  EKG   Radiology No results found.  Procedures Nail Removal  Date/Time: 09/26/2024 9:50 AM  Performed by: Enedelia Dorna HERO, FNP Authorized by: Enedelia Dorna HERO, FNP   Consent:    Consent obtained:  Verbal   Consent given by:  Patient   Risks discussed:  Incomplete removal, bleeding, infection, pain and permanent nail deformity Universal protocol:    Patient identity confirmed:  Verbally with patient Pre-procedure details:    Skin preparation:  Alcohol Anesthesia:    Anesthesia method:  Nerve block   Block needle gauge:  25 G   Block anesthetic:  Bupivacaine 0.5% w/o epi   Block injection procedure:  Anatomic landmarks identified, anatomic landmarks palpated, introduced needle, negative aspiration for blood and incremental injection   Block outcome:  Anesthesia achieved Nail Removal:    Nail removed:  Partial   Nail side:  Lateral Post-procedure details:    Dressing: Bacitracin, nonstick gauze, 1 inch Coban.  (including critical care time)  Medications Ordered in UC Medications - No data to display  Initial Impression / Assessment and Plan / UC Course  I have reviewed the triage vital signs and the nursing notes.  Pertinent labs & imaging results that were available during my care of the patient were reviewed by me and considered in my medical decision making (see chart for details).   1.  Avulsion of nail of right thumb Patient tolerated procedure well.  Complete anesthesia achieved with right thumb nerve block using bupivacaine. No current signs of cellulitis/bacterial infection. Infection precautions discussed. Recommend Epsom salt soaks every 4-6 hours for the next few days. Aquaphor ointment and dressing changes twice daily for the next few days with nonstick gauze and Coban. Follow-up with PCP as needed.  Counseled patient on potential for  adverse effects with medications prescribed/recommended today, strict ER and return-to-clinic precautions discussed, patient verbalized understanding.    Final Clinical Impressions(s) / UC Diagnoses   Final diagnoses:  Avulsion of nail of right thumb     Discharge Instructions      We removed your nails today. Watch for signs of infection such as redness, swelling, pus, or pain. Use Epsom salt soaks every 4-6 hours for the next 3 to 4 days. Change the dressing/bandage every 12 hours for the next 2 to 3 days, then you may leave the area open to air at nighttime and cover it during the day. Use Aquaphor to the wound every 12 hours to keep it moist.  If you develop any new or worsening symptoms or if your symptoms do not start to improve, please return here or follow-up with your primary care provider. If your symptoms are severe, please go to the emergency room.=   ED Prescriptions   None    PDMP not reviewed this encounter.    [1]  Social History Tobacco Use   Smoking status: Never   Smokeless tobacco: Never  Vaping Use   Vaping status: Never Used  Substance Use Topics   Alcohol use: Not Currently   Drug use: Never     Enedelia Dorna HERO, FNP 09/26/24 [phone]  "

## 2024-09-26 NOTE — ED Notes (Signed)
 Site dressed post nail removal with bacitracin, nonadherent gauze, secured with coban per verbal of NP. Pt tolerated well.  Site management and infection prevention education reviewed. Pt verbalized understanding.

## 2024-09-26 NOTE — ED Triage Notes (Addendum)
 Pt reports caught thumbnail on something several days ago and reports has been continuing to catch it on things. No active bleeding. Nail still attached on right side of thumb on right hand.

## 2024-09-26 NOTE — ED Notes (Signed)
 Pt tolerated nerve block procedure well. Pt sitting upright and tolerating PO fluids. Pt has hand hanging down per NP instructions. NAD noted.

## 2024-09-26 NOTE — Discharge Instructions (Signed)
 We removed your nails today. Watch for signs of infection such as redness, swelling, pus, or pain. Use Epsom salt soaks every 4-6 hours for the next 3 to 4 days. Change the dressing/bandage every 12 hours for the next 2 to 3 days, then you may leave the area open to air at nighttime and cover it during the day. Use Aquaphor to the wound every 12 hours to keep it moist.  If you develop any new or worsening symptoms or if your symptoms do not start to improve, please return here or follow-up with your primary care provider. If your symptoms are severe, please go to the emergency room.=
